# Patient Record
Sex: Female | Born: 1980 | Race: Black or African American | Hispanic: No | Marital: Single | Smoking: Current some day smoker
Health system: Southern US, Community
[De-identification: ages and names within clinical notes are randomized; demographics above are authoritative.]

## PROBLEM LIST (undated history)

## (undated) DIAGNOSIS — I10 Essential (primary) hypertension: Secondary | ICD-10-CM

---

## 2004-08-20 ENCOUNTER — Emergency Department: Payer: Self-pay | Admitting: Emergency Medicine

## 2004-11-24 ENCOUNTER — Emergency Department: Payer: Self-pay | Admitting: Emergency Medicine

## 2005-09-11 ENCOUNTER — Emergency Department: Payer: Self-pay | Admitting: Emergency Medicine

## 2006-05-10 ENCOUNTER — Emergency Department: Payer: Self-pay | Admitting: Emergency Medicine

## 2006-09-26 ENCOUNTER — Emergency Department: Payer: Self-pay | Admitting: Emergency Medicine

## 2006-11-08 ENCOUNTER — Observation Stay: Payer: Self-pay | Admitting: Unknown Physician Specialty

## 2006-11-16 ENCOUNTER — Observation Stay: Payer: Self-pay | Admitting: Obstetrics & Gynecology

## 2006-11-19 ENCOUNTER — Inpatient Hospital Stay: Payer: Self-pay | Admitting: Unknown Physician Specialty

## 2008-03-07 ENCOUNTER — Emergency Department: Payer: Self-pay | Admitting: Emergency Medicine

## 2009-04-09 ENCOUNTER — Emergency Department: Payer: Self-pay | Admitting: Emergency Medicine

## 2010-05-07 ENCOUNTER — Emergency Department: Payer: Self-pay | Admitting: Unknown Physician Specialty

## 2010-05-22 ENCOUNTER — Emergency Department: Payer: Self-pay | Admitting: Emergency Medicine

## 2010-10-18 ENCOUNTER — Observation Stay: Payer: Self-pay

## 2010-11-06 ENCOUNTER — Inpatient Hospital Stay: Payer: Self-pay | Admitting: Obstetrics and Gynecology

## 2010-11-13 LAB — PATHOLOGY REPORT

## 2010-11-24 ENCOUNTER — Emergency Department: Payer: Self-pay | Admitting: Emergency Medicine

## 2011-01-27 ENCOUNTER — Emergency Department (HOSPITAL_COMMUNITY)
Admission: EM | Admit: 2011-01-27 | Discharge: 2011-01-27 | Disposition: A | Discharge status: Home / Self Care | Payer: Self-pay | Attending: Emergency Medicine | Admitting: Emergency Medicine

## 2011-01-27 DIAGNOSIS — W269XXA Contact with unspecified sharp object(s), initial encounter: Secondary | ICD-10-CM | POA: Insufficient documentation

## 2011-01-27 DIAGNOSIS — S058X9A Other injuries of unspecified eye and orbit, initial encounter: Secondary | ICD-10-CM | POA: Insufficient documentation

## 2011-01-27 DIAGNOSIS — Y93E8 Activity, other personal hygiene: Secondary | ICD-10-CM | POA: Insufficient documentation

## 2011-01-27 DIAGNOSIS — Y92009 Unspecified place in unspecified non-institutional (private) residence as the place of occurrence of the external cause: Secondary | ICD-10-CM | POA: Insufficient documentation

## 2011-01-27 DIAGNOSIS — Y998 Other external cause status: Secondary | ICD-10-CM | POA: Insufficient documentation

## 2011-02-11 ENCOUNTER — Emergency Department (HOSPITAL_COMMUNITY)
Admission: EM | Admit: 2011-02-11 | Discharge: 2011-02-11 | Disposition: A | Discharge status: Home / Self Care | Payer: Self-pay | Attending: Emergency Medicine | Admitting: Emergency Medicine

## 2011-02-11 DIAGNOSIS — R109 Unspecified abdominal pain: Secondary | ICD-10-CM | POA: Insufficient documentation

## 2011-02-11 LAB — CBC
Hemoglobin: 13.9 g/dL (ref 12.0–15.0)
MCH: 30 pg (ref 26.0–34.0)
Platelets: 292 10*3/uL (ref 150–400)
RBC: 4.64 MIL/uL (ref 3.87–5.11)
WBC: 7.5 10*3/uL (ref 4.0–10.5)

## 2011-02-11 LAB — DIFFERENTIAL
Basophils Absolute: 0 10*3/uL (ref 0.0–0.1)
Basophils Relative: 0 % (ref 0–1)
Monocytes Relative: 10 % (ref 3–12)
Neutro Abs: 4.6 10*3/uL (ref 1.7–7.7)
Neutrophils Relative %: 61 % (ref 43–77)

## 2011-02-11 LAB — COMPREHENSIVE METABOLIC PANEL
ALT: 23 U/L (ref 0–35)
AST: 19 U/L (ref 0–37)
Alkaline Phosphatase: 71 U/L (ref 39–117)
CO2: 23 mEq/L (ref 19–32)
Chloride: 105 mEq/L (ref 96–112)
GFR calc Af Amer: >60 mL/min (ref >60)
GFR calc non Af Amer: >60 mL/min (ref >60)
Glucose, Bld: 77 mg/dL (ref 70–99)
Potassium: 3.8 mEq/L (ref 3.5–5.1)
Sodium: 137 mEq/L (ref 135–145)
Total Bilirubin: 0.5 mg/dL (ref 0.3–1.2)

## 2011-02-11 LAB — URINALYSIS, ROUTINE W REFLEX MICROSCOPIC
Bilirubin Urine: NEGATIVE
Glucose, UA: NEGATIVE mg/dL
Hgb urine dipstick: NEGATIVE
Protein, ur: NEGATIVE mg/dL
Urobilinogen, UA: 0.2 mg/dL (ref 0.0–1.0)

## 2011-02-11 LAB — WET PREP, GENITAL: Yeast Wet Prep HPF POC: NONE SEEN

## 2011-02-13 LAB — GC/CHLAMYDIA PROBE AMP, GENITAL: GC Probe Amp, Genital: NEGATIVE

## 2011-11-05 ENCOUNTER — Emergency Department: Payer: Self-pay | Admitting: Emergency Medicine

## 2012-03-25 ENCOUNTER — Emergency Department: Payer: Self-pay | Admitting: *Deleted

## 2013-01-21 ENCOUNTER — Emergency Department: Payer: Self-pay | Admitting: Internal Medicine

## 2013-10-04 ENCOUNTER — Emergency Department: Payer: Self-pay

## 2013-10-05 LAB — BETA STREP CULTURE(ARMC)

## 2014-05-05 ENCOUNTER — Emergency Department: Payer: Self-pay | Admitting: Emergency Medicine

## 2014-07-08 ENCOUNTER — Emergency Department: Payer: Self-pay | Admitting: Internal Medicine

## 2014-07-08 LAB — DRUG SCREEN, URINE

## 2014-07-08 LAB — URINALYSIS, COMPLETE
BACTERIA: NONE SEEN
BILIRUBIN, UR: NEGATIVE
Blood: NEGATIVE
GLUCOSE, UR: NEGATIVE mg/dL (ref 0–75)
Ketone: NEGATIVE
NITRITE: NEGATIVE
Ph: 5 (ref 4.5–8.0)
Protein: NEGATIVE
RBC,UR: 5 /HPF (ref 0–5)
Specific Gravity: 1.019 (ref 1.003–1.030)
WBC UR: 11 /HPF (ref 0–5)

## 2014-07-08 LAB — COMPREHENSIVE METABOLIC PANEL
ALBUMIN: 3.2 g/dL — AB (ref 3.4–5.0)
ALK PHOS: 59 U/L
ALT: 33 U/L
ANION GAP: 6 — AB (ref 7–16)
AST: 36 U/L (ref 15–37)
BILIRUBIN TOTAL: 0.4 mg/dL (ref 0.2–1.0)
BUN: 9 mg/dL (ref 7–18)
CALCIUM: 8.1 mg/dL — AB (ref 8.5–10.1)
Chloride: 110 mmol/L — ABNORMAL HIGH (ref 98–107)
Co2: 24 mmol/L (ref 21–32)
Creatinine: 0.52 mg/dL — ABNORMAL LOW (ref 0.60–1.30)
EGFR (Non-African Amer.): >60
Glucose: 98 mg/dL (ref 65–99)
OSMOLALITY: 278 (ref 275–301)
Potassium: 4.4 mmol/L (ref 3.5–5.1)
Sodium: 140 mmol/L (ref 136–145)
TOTAL PROTEIN: 7.1 g/dL (ref 6.4–8.2)

## 2014-07-08 LAB — TROPONIN I: Troponin-I: <0.02 ng/mL

## 2014-07-08 LAB — CBC
HCT: 39.5 % (ref 35.0–47.0)
HGB: 12.9 g/dL (ref 12.0–16.0)
MCH: 30.7 pg (ref 26.0–34.0)
MCHC: 32.5 g/dL (ref 32.0–36.0)
MCV: 94 fL (ref 80–100)
Platelet: 259 10*3/uL (ref 150–440)
RBC: 4.18 10*6/uL (ref 3.80–5.20)
RDW: 13 % (ref 11.5–14.5)
WBC: 7 10*3/uL (ref 3.6–11.0)

## 2014-07-08 LAB — TSH: THYROID STIMULATING HORM: 0.623 u[IU]/mL

## 2015-05-27 ENCOUNTER — Encounter: Payer: Self-pay | Admitting: Emergency Medicine

## 2015-05-27 ENCOUNTER — Emergency Department
Admission: EM | Admit: 2015-05-27 | Discharge: 2015-05-27 | Disposition: A | Discharge status: Home / Self Care | Payer: Medicaid Other | Attending: Emergency Medicine | Admitting: Emergency Medicine

## 2015-05-27 DIAGNOSIS — Z202 Contact with and (suspected) exposure to infections with a predominantly sexual mode of transmission: Secondary | ICD-10-CM | POA: Insufficient documentation

## 2015-05-27 DIAGNOSIS — N760 Acute vaginitis: Secondary | ICD-10-CM | POA: Diagnosis not present

## 2015-05-27 DIAGNOSIS — L293 Anogenital pruritus, unspecified: Secondary | ICD-10-CM | POA: Diagnosis present

## 2015-05-27 DIAGNOSIS — Z711 Person with feared health complaint in whom no diagnosis is made: Secondary | ICD-10-CM

## 2015-05-27 DIAGNOSIS — Z720 Tobacco use: Secondary | ICD-10-CM | POA: Insufficient documentation

## 2015-05-27 DIAGNOSIS — B9689 Other specified bacterial agents as the cause of diseases classified elsewhere: Secondary | ICD-10-CM

## 2015-05-27 DIAGNOSIS — I10 Essential (primary) hypertension: Secondary | ICD-10-CM | POA: Diagnosis not present

## 2015-05-27 DIAGNOSIS — Z3202 Encounter for pregnancy test, result negative: Secondary | ICD-10-CM | POA: Insufficient documentation

## 2015-05-27 HISTORY — DX: Essential (primary) hypertension: I10

## 2015-05-27 LAB — URINALYSIS COMPLETE WITH MICROSCOPIC (ARMC ONLY)
Bacteria, UA: NONE SEEN
Bilirubin Urine: NEGATIVE
GLUCOSE, UA: NEGATIVE mg/dL
Hgb urine dipstick: NEGATIVE
KETONES UR: NEGATIVE mg/dL
NITRITE: NEGATIVE
Protein, ur: NEGATIVE mg/dL
SPECIFIC GRAVITY, URINE: 1.017 (ref 1.005–1.030)
pH: 5 (ref 5.0–8.0)

## 2015-05-27 LAB — CHLAMYDIA/NGC RT PCR (ARMC ONLY)
Chlamydia Tr: NOT DETECTED
Chlamydia Tr: INVALID
N GONORRHOEAE: INVALID
N gonorrhoeae: NOT DETECTED

## 2015-05-27 LAB — POCT PREGNANCY, URINE: Preg Test, Ur: NEGATIVE

## 2015-05-27 LAB — WET PREP, GENITAL

## 2015-05-27 MED ORDER — CEFTRIAXONE SODIUM 250 MG IJ SOLR
250.0000 mg | Freq: Once | INTRAMUSCULAR | Status: AC
  Administered 2015-05-27: 250 mg via INTRAMUSCULAR
  Filled 2015-05-27: qty 250

## 2015-05-27 MED ORDER — METRONIDAZOLE 500 MG PO TABS: 500.0000 mg | ORAL_TABLET | Freq: Two times a day (BID) | ORAL | Status: DC

## 2015-05-27 MED ORDER — LIDOCAINE HCL (PF) 1 % IJ SOLN
INTRAMUSCULAR | Status: AC
  Administered 2015-05-27: 0.9 mL
  Filled 2015-05-27: qty 5

## 2015-05-27 MED ORDER — AZITHROMYCIN 250 MG PO TABS
1000.0000 mg | ORAL_TABLET | Freq: Once | ORAL | Status: AC
  Administered 2015-05-27: 1000 mg via ORAL
  Filled 2015-05-27: qty 4

## 2015-05-27 NOTE — ED Notes (Signed)
POC urine pregnancy completed. Result: Negative.   

## 2015-05-27 NOTE — ED Notes (Signed)
Reports pain in vagina, denies discharge.

## 2015-05-27 NOTE — ED Notes (Signed)
Talked with patient via telephone,   She requested that her discharge papers and rx be mailed.

## 2015-05-27 NOTE — Discharge Instructions (Signed)
Bacterial Vaginosis Bacterial vaginosis is a vaginal infection that occurs when the normal balance of bacteria in the vagina is disrupted. It results from an overgrowth of certain bacteria. This is the most common vaginal infection in women of childbearing age. Treatment is important to prevent complications, especially in pregnant women, as it can cause a premature delivery. CAUSES  Bacterial vaginosis is caused by an increase in harmful bacteria that are normally present in smaller amounts in the vagina. Several different kinds of bacteria can cause bacterial vaginosis. However, the reason that the condition develops is not fully understood. RISK FACTORS Certain activities or behaviors can put you at an increased risk of developing bacterial vaginosis, including:  Having a new sex partner or multiple sex partners.  Douching.  Using an intrauterine device (IUD) for contraception. Women do not get bacterial vaginosis from toilet seats, bedding, swimming pools, or contact with objects around them. SIGNS AND SYMPTOMS  Some women with bacterial vaginosis have no signs or symptoms. Common symptoms include:  Grey vaginal discharge.  A fishlike odor with discharge, especially after sexual intercourse.  Itching or burning of the vagina and vulva.  Burning or pain with urination. DIAGNOSIS  Your health care provider will take a medical history and examine the vagina for signs of bacterial vaginosis. A sample of vaginal fluid may be taken. Your health care provider will look at this sample under a microscope to check for bacteria and abnormal cells. A vaginal pH test may also be done.  TREATMENT  Bacterial vaginosis may be treated with antibiotic medicines. These may be given in the form of a pill or a vaginal cream. A second round of antibiotics may be prescribed if the condition comes back after treatment.  HOME CARE INSTRUCTIONS   Only take over-the-counter or prescription medicines as  directed by your health care provider.  If antibiotic medicine was prescribed, take it as directed. Make sure you finish it even if you start to feel better.  Do not have sex until treatment is completed.  Tell all sexual partners that you have a vaginal infection. They should see their health care provider and be treated if they have problems, such as a mild rash or itching.  Practice safe sex by using condoms and only having one sex partner. SEEK MEDICAL CARE IF:   Your symptoms are not improving after 3 days of treatment.  You have increased discharge or pain.  You have a fever. MAKE SURE YOU:   Understand these instructions.  Will watch your condition.  Will get help right away if you are not doing well or get worse. FOR MORE INFORMATION  Centers for Disease Control and Prevention, Division of STD Prevention: www.cdc.gov/std American Sexual Health Association (ASHA): www.ashastd.org  Document Released: 08/20/2005 Document Revised: 06/10/2013 Document Reviewed: 04/01/2013 ExitCare Patient Information 2015 [employer]. This information is not intended to replace advice given to you by your health care provider. Make sure you discuss any questions you have with your health care provider.  

## 2015-05-27 NOTE — ED Provider Notes (Signed)
[facility] Emergency Department Provider Note ____________________________________________  Time seen: Approximately 11:26 AM  I have reviewed the triage vital signs and the nursing notes.   HISTORY  Chief Complaint Vaginal Itching   HPI Lacey Tanner is a 34 y.o. female is here with complaint of vaginal pain with some itching for approximately 3 weeks. She denies any vaginal discharge.Patient states that she had her tubes tied 4 years ago and has not had any problems since then. She states that she has Pap smears done either at the health department or at [facility] health clinic. She was seen at the health Department several months ago where she was given 4 tablets for a bacterial infection. She denies any chance that she's been exposed to STD. She rates her pain is 7 out of 10. She states that nothing makes it worse or better.   Past Medical History  Diagnosis Date  . Hypertension     There are no active problems to display for this patient.   History reviewed. No pertinent past surgical history.  Current Outpatient Rx  Name  Route  Sig  Dispense  Refill  . metroNIDAZOLE (FLAGYL) 500 MG tablet   Oral   Take 1 tablet (500 mg total) by mouth 2 (two) times daily.   14 tablet   0     Allergies Review of patient's allergies indicates no known allergies.  History reviewed. No pertinent family history.  Social History Social History  Substance Use Topics  . Smoking status: Current Every Day Smoker  . Smokeless tobacco: None  . Alcohol Use: None    Review of Systems Constitutional: No fever/chills Eyes: No visual changes. ENT: No sore throat. Cardiovascular: Denies chest pain. Respiratory: Denies shortness of breath. Gastrointestinal: No abdominal pain.  No nausea, no vomiting.  No diarrhea.  No constipation. Genitourinary: Negative for dysuria. Positive pelvic pain, negative discharge Musculoskeletal: Negative for back pain. Skin:  Negative for rash. Neurological: Negative for headaches, focal weakness or numbness.  10-point ROS otherwise negative.  ____________________________________________   PHYSICAL EXAM:  VITAL SIGNS: ED Triage Vitals  Enc Vitals Group     BP 05/27/15 1058 186/121 mmHg     Pulse Rate 05/27/15 1058 82     Resp 05/27/15 1058 18     Temp 05/27/15 1058 98.2 F (36.8 C)     Temp Source 05/27/15 1058 Oral     SpO2 05/27/15 1058 99 %     Weight 05/27/15 1058 220 lb (99.791 kg)     Height 05/27/15 1058  (1.727 m)     Head Cir --      Peak Flow --      Pain Score 05/27/15 1058 7     Pain Loc --      Pain Edu? --      Excl. in GC? --     Constitutional: Alert and oriented. Well appearing and in no acute distress. Eyes: Conjunctivae are normal. PERRL. EOMI. Head: Atraumatic. Nose: No congestion/rhinnorhea. Neck: No stridor.   Cardiovascular: Normal rate, regular rhythm. Grossly normal heart sounds.  Good peripheral circulation. Respiratory: Normal respiratory effort.  No retractions. Lungs CTAB. Gastrointestinal: Soft and nontender. No distention.  Genitourinary: No external rashes or discharge was noted. There is white mucous-type secretions in the vaginal vault and at the cervical os. There is no cervical motion tenderness. There was some right adnexal tenderness that was mild but no masses. Left adnexal no masses or tenderness noted. Musculoskeletal: No  lower extremity tenderness nor edema.  No joint effusions. Neurologic:  Normal speech and language. No gross focal neurologic deficits are appreciated. No gait instability. Skin:  Skin is warm, dry and intact. No rash noted. Psychiatric: Mood and affect are normal. Speech and behavior are normal.  ____________________________________________   LABS (all labs ordered are listed, but only abnormal results are displayed)  Labs Reviewed  WET PREP, GENITAL - Abnormal; Notable for the following:    Yeast Wet Prep HPF POC NONE (*)     Trich, Wet Prep NONE (*)    Clue Cells Wet Prep HPF POC FEW (*)    WBC, Wet Prep HPF POC TOO NUMEROUS TO COUNT (*)    All other components within normal limits  URINALYSIS COMPLETEWITH MICROSCOPIC ([facility] ONLY) - Abnormal; Notable for the following:    Color, Urine YELLOW (*)    APPearance CLEAR (*)    Leukocytes, UA 1+ (*)    Squamous Epithelial / LPF 0-5 (*)    All other components within normal limits  CHLAMYDIA/NGC RT PCR ([facility] ONLY)  POC URINE PREG, ED  POCT PREGNANCY, URINE    PROCEDURES  Procedure(s) performed: None  Critical Care performed: No  ____________________________________________   INITIAL IMPRESSION / ASSESSMENT AND PLAN / ED COURSE  Pertinent labs & imaging results that were available during my care of the patient were reviewed by me and considered in my medical decision making (see chart for details).  Prior discharge patient states that she "had a fling". She states that in the beginning she had protected sex but the next morning had penetration without protection. She is unaware if this person was having any problems at that time or at present. Discussed the findings on her wet prep and due to the changes in her story it was decided that she would be treated for STD. GC and chlamydia test was still pending prior to her discharge. She is also given a prescription for Flagyl to be taken twice a day for 7 days. ____________________________________________   FINAL CLINICAL IMPRESSION(S) / ED DIAGNOSES  Final diagnoses:  Bacterial vaginosis  Concern about STD in female without diagnosis      Tommi Rumps, PA-C 05/27/15 1440  Minna Antis, MD 05/27/15 1600

## 2015-05-27 NOTE — ED Notes (Signed)
Just spoke with patient via telephone and she requested that her prescriptions be mailed.   After talking with charge nurse,  pts d/c papers were placed in an envelope and placed at the first nurse desk.   atttempted to reach patient again to let her know but she is not answering the phone and there is no way to leave a message.

## 2015-05-27 NOTE — ED Notes (Signed)
Attempted to call pt twice to let her know she left without her prescriptions. Unable to reach pt at this time.

## 2015-05-27 NOTE — ED Notes (Signed)
Presents with vaginal pain which is radiating into lower back   Sx's started about 3 weeks ago. worse today

## 2015-12-14 ENCOUNTER — Encounter: Payer: Self-pay | Admitting: Emergency Medicine

## 2015-12-14 ENCOUNTER — Emergency Department
Admission: EM | Admit: 2015-12-14 | Discharge: 2015-12-14 | Disposition: A | Discharge status: Home / Self Care | Payer: Medicaid Other | Attending: Emergency Medicine | Admitting: Emergency Medicine

## 2015-12-14 ENCOUNTER — Emergency Department: Payer: Medicaid Other

## 2015-12-14 DIAGNOSIS — R1031 Right lower quadrant pain: Secondary | ICD-10-CM | POA: Diagnosis present

## 2015-12-14 DIAGNOSIS — Z792 Long term (current) use of antibiotics: Secondary | ICD-10-CM | POA: Insufficient documentation

## 2015-12-14 DIAGNOSIS — I10 Essential (primary) hypertension: Secondary | ICD-10-CM | POA: Insufficient documentation

## 2015-12-14 DIAGNOSIS — R109 Unspecified abdominal pain: Secondary | ICD-10-CM

## 2015-12-14 DIAGNOSIS — F17200 Nicotine dependence, unspecified, uncomplicated: Secondary | ICD-10-CM | POA: Insufficient documentation

## 2015-12-14 LAB — BASIC METABOLIC PANEL
ANION GAP: 5 (ref 5–15)
BUN: 10 mg/dL (ref 6–20)
CO2: 23 mmol/L (ref 22–32)
Calcium: 8.8 mg/dL — ABNORMAL LOW (ref 8.9–10.3)
Chloride: 108 mmol/L (ref 101–111)
Creatinine, Ser: 0.8 mg/dL (ref 0.44–1.00)
GFR calc Af Amer: >60 mL/min (ref >60)
GLUCOSE: 83 mg/dL (ref 65–99)
POTASSIUM: 3.7 mmol/L (ref 3.5–5.1)
Sodium: 136 mmol/L (ref 135–145)

## 2015-12-14 LAB — URINALYSIS COMPLETE WITH MICROSCOPIC (ARMC ONLY)
BILIRUBIN URINE: NEGATIVE
GLUCOSE, UA: NEGATIVE mg/dL
KETONES UR: NEGATIVE mg/dL
Leukocytes, UA: NEGATIVE
Nitrite: NEGATIVE
PROTEIN: NEGATIVE mg/dL
Specific Gravity, Urine: 1.006 (ref 1.005–1.030)
pH: 6 (ref 5.0–8.0)

## 2015-12-14 LAB — CBC
HEMATOCRIT: 37.4 % (ref 35.0–47.0)
HEMOGLOBIN: 12.6 g/dL (ref 12.0–16.0)
MCH: 31.4 pg (ref 26.0–34.0)
MCHC: 33.8 g/dL (ref 32.0–36.0)
MCV: 92.9 fL (ref 80.0–100.0)
Platelets: 273 10*3/uL (ref 150–440)
RBC: 4.02 MIL/uL (ref 3.80–5.20)
RDW: 13.6 % (ref 11.5–14.5)
WBC: 8.5 10*3/uL (ref 3.6–11.0)

## 2015-12-14 LAB — PREGNANCY, URINE: PREG TEST UR: NEGATIVE

## 2015-12-14 MED ORDER — KETOROLAC TROMETHAMINE 30 MG/ML IJ SOLN
30.0000 mg | Freq: Once | INTRAMUSCULAR | Status: AC
  Administered 2015-12-14: 30 mg via INTRAMUSCULAR

## 2015-12-14 MED ORDER — KETOROLAC TROMETHAMINE 30 MG/ML IJ SOLN
INTRAMUSCULAR | Status: AC
  Administered 2015-12-14: 30 mg via INTRAMUSCULAR
  Filled 2015-12-14: qty 1

## 2015-12-14 NOTE — Discharge Instructions (Signed)
Flank Pain Flank pain refers to pain that is located on the side of the body between the upper abdomen and the back. The pain may occur over a short period of time (acute) or may be long-term or reoccurring (chronic). It may be mild or severe. Flank pain can be caused by many things. CAUSES  Some of the more common causes of flank pain include:  Muscle strains.   Muscle spasms.   A disease of your spine (vertebral disk disease).   A lung infection (pneumonia).   Fluid around your lungs (pulmonary edema).   A kidney infection.   Kidney stones.   A very painful skin rash caused by the chickenpox virus (shingles).   Gallbladder disease.  HOME CARE INSTRUCTIONS  Home care will depend on the cause of your pain. In general,  Rest as directed by your caregiver.  Drink enough fluids to keep your urine clear or pale yellow.  Only take over-the-counter or prescription medicines as directed by your caregiver. Some medicines may help relieve the pain.  Tell your caregiver about any changes in your pain.  Follow up with your caregiver as directed. SEEK IMMEDIATE MEDICAL CARE IF:   Your pain is not controlled with medicine.   You have new or worsening symptoms.  Your pain increases.   You have abdominal pain.   You have shortness of breath.   You have persistent nausea or vomiting.   You have swelling in your abdomen.   You feel faint or pass out.   You have blood in your urine.  You have a fever or persistent symptoms for more than 2-3 days.  You have a fever and your symptoms suddenly get worse. MAKE SURE YOU:   Understand these instructions.  Will watch your condition.  Will get help right away if you are not doing well or get worse.   This information is not intended to replace advice given to you by your health care provider. Make sure you discuss any questions you have with your health care provider.   Document Released: 10/11/2005 Document  Revised: 05/14/2012 Document Reviewed: 04/03/2012 Elsevier Interactive Patient Education [employer]2016 [employer]vier Inc.   Please return immediately if condition worsens. Please contact her primary physician or the physician you were given for referral. If you have any specialist physicians involved in her treatment and plan please also contact them. Thank you for using [facility]. Continue with over-the-counter pain medication such as ibuprofen (Motrin) and/or Tylenol. Return emergency department especially for fever.

## 2015-12-14 NOTE — ED Provider Notes (Signed)
Time Seen: Approximately 1433  I have reviewed the triage notes  Chief Complaint: Flank Pain   History of Present Illness: Lacey Tanner is a 35 y.o. female who presents with acute onset of right-sided flank and lower abdominal pain started approximately 2 hours prior to arrival. She denies any associated symptoms such as nausea vomiting, fever. States she has some mild irritation at the end of her urine stream. She hasn't noticed any hematuria. Patient states she took a pain medication at home (she is not sure what the name of it was). She denies any left-sided abdominal or flank pain. She denies any vaginal discharge or bleeding.   Past Medical History  Diagnosis Date  . Hypertension     There are no active problems to display for this patient.   History reviewed. No pertinent past surgical history.  History reviewed. No pertinent past surgical history.  Current Outpatient Rx  Name  Route  Sig  Dispense  Refill  . metroNIDAZOLE (FLAGYL) 500 MG tablet   Oral   Take 1 tablet (500 mg total) by mouth 2 (two) times daily.   14 tablet   0     Allergies:  Review of patient's allergies indicates no known allergies.  Family History: No family history on file.  Social History: Social History  Substance Use Topics  . Smoking status: Current Every Day Smoker -- 0.50 packs/day  . Smokeless tobacco: None  . Alcohol Use: Yes     Comment: occasional     Review of Systems:   10 point review of systems was performed and was otherwise negative:  Constitutional: No fever Eyes: No visual disturbances ENT: No sore throat, ear pain Cardiac: No chest pain Respiratory: No shortness of breath, wheezing, or stridor Abdomen:Right-sided flank pain. No nausea vomiting or diarrhea Endocrine: No weight loss, No night sweats Extremities: Describes numbness for a month on the right lateral surface of her pinky. No weakness Skin: No rashes, easy bruising Neurologic: No focal  weakness, trouble with speech or swollowing Urologic: No dysuria, Hematuria, or urinary frequency   Physical Exam:  ED Triage Vitals  Enc Vitals Group     BP 12/14/15 1355 206/11 mmHg     Pulse Rate 12/14/15 1355 72     Resp 12/14/15 1355 18     Temp 12/14/15 1355 98.2 F (36.8 C)     Temp Source 12/14/15 1355 Oral     SpO2 12/14/15 1355 100 %     Weight 12/14/15 1355 208 lb (94.348 kg)     Height 12/14/15 1355 5\' 8"  (1.727 m)     Head Cir --      Peak Flow --      Pain Score 12/14/15 1351 10     Pain Loc --      Pain Edu? --      Excl. in GC? --     General: Awake , Alert , and Oriented times 3; GCS 15 Head: Normal cephalic , atraumatic Eyes: Pupils equal , round, reactive to light Nose/Throat: No nasal drainage, patent upper airway without erythema or exudate.  Neck: Supple, Full range of motion, No anterior adenopathy or palpable thyroid masses Lungs: Clear to ascultation without wheezes , rhonchi, or rales Heart: Regular rate, regular rhythm without murmurs , gallops , or rubs Abdomen: Mildly obese Soft, non tender without rebound, guarding , or rigidity; bowel sounds positive and symmetric in all 4 quadrants. No organomegaly .       No  focal tenderness over McBurney's point. Negative Murphy's sign Extremities: 2 plus symmetric pulses. No edema, clubbing or cyanosis Neurologic: normal ambulation, Motor symmetric without deficits, sensory intact Skin: warm, dry, no rashes   Labs:   All laboratory work was reviewed including any pertinent negatives or positives listed below:  Labs Reviewed  URINALYSIS COMPLETEWITH MICROSCOPIC ([facility] ONLY) - Abnormal; Notable for the following:    Color, Urine STRAW (*)    APPearance CLEAR (*)    Hgb urine dipstick 1+ (*)    Bacteria, UA RARE (*)    Squamous Epithelial / LPF 0-5 (*)    All other components within normal limits  BASIC METABOLIC PANEL - Abnormal; Notable for the following:    Calcium 8.8 (*)    All other components  within normal limits  CBC  Laboratory work was reviewed and showed no clinically significant abnormalities.    Radiology:    EXAM: CT ABDOMEN AND PELVIS WITHOUT CONTRAST  TECHNIQUE: Multidetector CT imaging of the abdomen and pelvis was performed following the standard protocol without IV contrast.  COMPARISON: None.  FINDINGS: Visualized lung bases are unremarkable. No significant osseous abnormality is noted.  Small gallstones are noted. No focal abnormality is noted in the liver, spleen or pancreas on these unenhanced images. Adrenal glands and kidneys are unremarkable. No hydronephrosis or renal obstruction is noted. No renal or ureteral calculi are noted. The appendix appears normal. There is no evidence of bowel obstruction. No abnormal fluid collection is noted. Urinary bladder appears normal. Uterus and ovaries are unremarkable. No significant adenopathy is noted.  IMPRESSION: Minimal cholelithiasis. No hydronephrosis or renal obstruction is noted. No renal or ureteral calculi are noted.     I personally reviewed the radiologic studies    ED Course:  Differential and felt was either musculoskeletal pain, acute cholecystitis, renal colic, acute appendicitis, urinary tract infection, etc. The patient's objective studies all appeared to be within normal limits and did not appear to be any significant reason that require hospitalization. Patient was advised of the findings of her workup.   Assessment: * Acute unspecified right flank pain  Final Clinical Impression:   Final diagnoses:  Right flank pain     Plan: * Outpatient Patient was advised to return immediately if condition worsens. Patient was advised to follow up with their primary care physician or other specialized physicians involved in their outpatient care. The patient and/or family member/power of attorney had laboratory results reviewed at the bedside. All questions and concerns were  addressed and appropriate discharge instructions were distributed by the nursing staff.             Jennye Moccasin, MD 12/14/15 [phone]

## 2015-12-14 NOTE — ED Notes (Signed)
Patient transported to CT 

## 2015-12-14 NOTE — ED Notes (Signed)
Pt comes into the Ed via POV c/o right sided flank pain and difficulty urinating.  Patient states the pain is in the lower right side of back.  Patient in NAD at this time.  H/o of previous kidney stones.

## 2016-03-28 ENCOUNTER — Emergency Department: Payer: Medicaid Other

## 2016-03-28 ENCOUNTER — Encounter: Payer: Self-pay | Admitting: Emergency Medicine

## 2016-03-28 ENCOUNTER — Emergency Department
Admission: EM | Admit: 2016-03-28 | Discharge: 2016-03-28 | Disposition: A | Discharge status: Home / Self Care | Payer: Medicaid Other | Attending: Emergency Medicine | Admitting: Emergency Medicine

## 2016-03-28 DIAGNOSIS — F17200 Nicotine dependence, unspecified, uncomplicated: Secondary | ICD-10-CM | POA: Diagnosis not present

## 2016-03-28 DIAGNOSIS — S0993XA Unspecified injury of face, initial encounter: Secondary | ICD-10-CM | POA: Diagnosis present

## 2016-03-28 DIAGNOSIS — I10 Essential (primary) hypertension: Secondary | ICD-10-CM | POA: Insufficient documentation

## 2016-03-28 DIAGNOSIS — Y939 Activity, unspecified: Secondary | ICD-10-CM | POA: Insufficient documentation

## 2016-03-28 DIAGNOSIS — R51 Headache: Secondary | ICD-10-CM | POA: Diagnosis not present

## 2016-03-28 DIAGNOSIS — R52 Pain, unspecified: Secondary | ICD-10-CM

## 2016-03-28 DIAGNOSIS — Y929 Unspecified place or not applicable: Secondary | ICD-10-CM | POA: Insufficient documentation

## 2016-03-28 DIAGNOSIS — Z5181 Encounter for therapeutic drug level monitoring: Secondary | ICD-10-CM | POA: Diagnosis not present

## 2016-03-28 DIAGNOSIS — S161XXA Strain of muscle, fascia and tendon at neck level, initial encounter: Secondary | ICD-10-CM | POA: Insufficient documentation

## 2016-03-28 DIAGNOSIS — Y999 Unspecified external cause status: Secondary | ICD-10-CM | POA: Diagnosis not present

## 2016-03-28 DIAGNOSIS — S0083XA Contusion of other part of head, initial encounter: Secondary | ICD-10-CM

## 2016-03-28 LAB — URINE DRUG SCREEN, QUALITATIVE (ARMC ONLY)
AMPHETAMINES, UR SCREEN: NOT DETECTED
Barbiturates, Ur Screen: NOT DETECTED
Benzodiazepine, Ur Scrn: NOT DETECTED
Cannabinoid 50 Ng, Ur ~~LOC~~: POSITIVE — AB
Cocaine Metabolite,Ur ~~LOC~~: NOT DETECTED
MDMA (ECSTASY) UR SCREEN: NOT DETECTED
METHADONE SCREEN, URINE: NOT DETECTED
OPIATE, UR SCREEN: NOT DETECTED
PHENCYCLIDINE (PCP) UR S: NOT DETECTED
Tricyclic, Ur Screen: NOT DETECTED

## 2016-03-28 LAB — POCT PREGNANCY, URINE: Preg Test, Ur: NEGATIVE

## 2016-03-28 MED ORDER — METHOCARBAMOL 500 MG PO TABS: 500.0000 mg | ORAL_TABLET | Freq: Four times a day (QID) | ORAL | 0 refills | Status: DC

## 2016-03-28 MED ORDER — HYDROCODONE-ACETAMINOPHEN 5-325 MG PO TABS: 1.0000 | ORAL_TABLET | ORAL | 0 refills | Status: DC | PRN
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
## 2016-03-28 NOTE — Discharge Instructions (Signed)
Follow-up with your primary care doctor at [facility] if any continued problems. Ice to your face as needed for swelling. Take pain medication only as directed. You may also take ibuprofen for inflammation and pain. Do not take this medication and drive.

## 2016-06-07 ENCOUNTER — Other Ambulatory Visit: Payer: Self-pay | Admitting: Family Medicine

## 2016-06-07 DIAGNOSIS — R102 Pelvic and perineal pain: Secondary | ICD-10-CM

## 2016-06-14 ENCOUNTER — Ambulatory Visit: Payer: Medicaid Other

## 2016-06-25 ENCOUNTER — Ambulatory Visit: Payer: Medicaid Other

## 2016-06-29 ENCOUNTER — Ambulatory Visit: Payer: Medicaid Other

## 2016-09-13 ENCOUNTER — Encounter: Payer: Self-pay | Admitting: Emergency Medicine

## 2016-09-13 ENCOUNTER — Emergency Department
Admission: EM | Admit: 2016-09-13 | Discharge: 2016-09-13 | Disposition: A | Discharge status: Home / Self Care | Payer: Medicaid Other | Attending: Emergency Medicine | Admitting: Emergency Medicine

## 2016-09-13 DIAGNOSIS — O26899 Other specified pregnancy related conditions, unspecified trimester: Secondary | ICD-10-CM | POA: Diagnosis not present

## 2016-09-13 DIAGNOSIS — R109 Unspecified abdominal pain: Secondary | ICD-10-CM | POA: Insufficient documentation

## 2016-09-13 DIAGNOSIS — Z5321 Procedure and treatment not carried out due to patient leaving prior to being seen by health care provider: Secondary | ICD-10-CM | POA: Diagnosis not present

## 2016-09-13 DIAGNOSIS — Z3A00 Weeks of gestation of pregnancy not specified: Secondary | ICD-10-CM | POA: Diagnosis not present

## 2016-09-13 LAB — URINALYSIS, COMPLETE (UACMP) WITH MICROSCOPIC
Bilirubin Urine: NEGATIVE
Glucose, UA: NEGATIVE mg/dL
Hgb urine dipstick: NEGATIVE
KETONES UR: NEGATIVE mg/dL
Nitrite: NEGATIVE
PROTEIN: NEGATIVE mg/dL
Specific Gravity, Urine: 1.01 (ref 1.005–1.030)
pH: 6 (ref 5.0–8.0)

## 2016-09-13 LAB — CBC
HEMATOCRIT: 38.6 % (ref 35.0–47.0)
HEMOGLOBIN: 13.2 g/dL (ref 12.0–16.0)
MCH: 32.3 pg (ref 26.0–34.0)
MCHC: 34.3 g/dL (ref 32.0–36.0)
MCV: 94.2 fL (ref 80.0–100.0)
Platelets: 283 10*3/uL (ref 150–440)
RBC: 4.1 MIL/uL (ref 3.80–5.20)
RDW: 13.5 % (ref 11.5–14.5)
WBC: 7.9 10*3/uL (ref 3.6–11.0)

## 2016-09-13 LAB — COMPREHENSIVE METABOLIC PANEL
ALBUMIN: 3.9 g/dL (ref 3.5–5.0)
ALK PHOS: 53 U/L (ref 38–126)
ALT: 22 U/L (ref 14–54)
ANION GAP: 7 (ref 5–15)
AST: 28 U/L (ref 15–41)
BUN: 9 mg/dL (ref 6–20)
CALCIUM: 9.2 mg/dL (ref 8.9–10.3)
CHLORIDE: 108 mmol/L (ref 101–111)
CO2: 21 mmol/L — AB (ref 22–32)
Creatinine, Ser: 0.6 mg/dL (ref 0.44–1.00)
GFR calc Af Amer: >60 mL/min (ref >60)
GFR calc non Af Amer: >60 mL/min (ref >60)
GLUCOSE: 97 mg/dL (ref 65–99)
Potassium: 3.7 mmol/L (ref 3.5–5.1)
SODIUM: 136 mmol/L (ref 135–145)
Total Bilirubin: 0.4 mg/dL (ref 0.3–1.2)
Total Protein: 7.4 g/dL (ref 6.5–8.1)

## 2016-09-13 LAB — LIPASE, BLOOD: Lipase: 17 U/L (ref 11–51)

## 2016-09-13 NOTE — ED Triage Notes (Signed)
Says she is pregnant ppossibly 30 weeks?   and is worried it is in tubes.  Says her tubse are tied.  Says she was supposed to go to someone, but never went quite a while back.  Lacey Tanner had referred her.   Pt was here with someone else and though she would check in while she was here.  Says she feels it moving around in t here.

## 2016-09-18 ENCOUNTER — Telehealth: Payer: Self-pay | Admitting: Family Medicine

## 2016-09-20 ENCOUNTER — Encounter: Payer: Self-pay | Admitting: Emergency Medicine

## 2016-09-20 ENCOUNTER — Emergency Department: Payer: Medicaid Other

## 2016-09-20 ENCOUNTER — Emergency Department
Admission: EM | Admit: 2016-09-20 | Discharge: 2016-09-20 | Disposition: A | Discharge status: Home / Self Care | Payer: Medicaid Other | Attending: Emergency Medicine | Admitting: Emergency Medicine

## 2016-09-20 ENCOUNTER — Ambulatory Visit: Payer: Medicaid Other

## 2016-09-20 DIAGNOSIS — R109 Unspecified abdominal pain: Secondary | ICD-10-CM

## 2016-09-20 DIAGNOSIS — I10 Essential (primary) hypertension: Secondary | ICD-10-CM | POA: Diagnosis not present

## 2016-09-20 DIAGNOSIS — F17200 Nicotine dependence, unspecified, uncomplicated: Secondary | ICD-10-CM | POA: Insufficient documentation

## 2016-09-20 DIAGNOSIS — N946 Dysmenorrhea, unspecified: Secondary | ICD-10-CM | POA: Insufficient documentation

## 2016-09-20 DIAGNOSIS — Z79899 Other long term (current) drug therapy: Secondary | ICD-10-CM | POA: Diagnosis not present

## 2016-09-20 DIAGNOSIS — R1030 Lower abdominal pain, unspecified: Secondary | ICD-10-CM | POA: Diagnosis present

## 2016-09-20 LAB — COMPREHENSIVE METABOLIC PANEL
ALT: 21 U/L (ref 14–54)
AST: 27 U/L (ref 15–41)
Albumin: 3.7 g/dL (ref 3.5–5.0)
Alkaline Phosphatase: 46 U/L (ref 38–126)
Anion gap: 5 (ref 5–15)
BILIRUBIN TOTAL: 0.9 mg/dL (ref 0.3–1.2)
BUN: 10 mg/dL (ref 6–20)
CHLORIDE: 110 mmol/L (ref 101–111)
CO2: 22 mmol/L (ref 22–32)
CREATININE: 0.55 mg/dL (ref 0.44–1.00)
Calcium: 9 mg/dL (ref 8.9–10.3)
GFR calc Af Amer: >60 mL/min (ref >60)
Glucose, Bld: 95 mg/dL (ref 65–99)
Potassium: 4.3 mmol/L (ref 3.5–5.1)
Sodium: 137 mmol/L (ref 135–145)
TOTAL PROTEIN: 7.1 g/dL (ref 6.5–8.1)

## 2016-09-20 LAB — CBC
HCT: 38.7 % (ref 35.0–47.0)
Hemoglobin: 13.3 g/dL (ref 12.0–16.0)
MCH: 32.2 pg (ref 26.0–34.0)
MCHC: 34.3 g/dL (ref 32.0–36.0)
MCV: 93.8 fL (ref 80.0–100.0)
PLATELETS: 337 10*3/uL (ref 150–440)
RBC: 4.13 MIL/uL (ref 3.80–5.20)
RDW: 13.5 % (ref 11.5–14.5)
WBC: 7.3 10*3/uL (ref 3.6–11.0)

## 2016-09-20 LAB — URINALYSIS, COMPLETE (UACMP) WITH MICROSCOPIC
Bilirubin Urine: NEGATIVE
Glucose, UA: NEGATIVE mg/dL
Ketones, ur: NEGATIVE mg/dL
Nitrite: NEGATIVE
Protein, ur: NEGATIVE mg/dL
SPECIFIC GRAVITY, URINE: 1.014 (ref 1.005–1.030)
pH: 5 (ref 5.0–8.0)

## 2016-09-20 LAB — WET PREP, GENITAL
Clue Cells Wet Prep HPF POC: NONE SEEN
Sperm: NONE SEEN
Trich, Wet Prep: NONE SEEN
Yeast Wet Prep HPF POC: NONE SEEN

## 2016-09-20 LAB — LIPASE, BLOOD: Lipase: 23 U/L (ref 11–51)

## 2016-09-20 LAB — CHLAMYDIA/NGC RT PCR (ARMC ONLY)
Chlamydia Tr: NOT DETECTED
N gonorrhoeae: NOT DETECTED

## 2016-09-20 LAB — PREGNANCY, URINE: Preg Test, Ur: NEGATIVE

## 2016-09-20 MED ORDER — IBUPROFEN 800 MG PO TABS
800.0000 mg | ORAL_TABLET | ORAL | Status: AC
  Administered 2016-09-20: 800 mg via ORAL
  Filled 2016-09-20: qty 1

## 2016-09-20 MED ORDER — ACETAMINOPHEN 500 MG PO TABS
1000.0000 mg | ORAL_TABLET | Freq: Once | ORAL | Status: AC
  Administered 2016-09-20: 1000 mg via ORAL
  Filled 2016-09-20: qty 2

## 2016-09-20 NOTE — ED Provider Notes (Signed)
[facility] Emergency Department Provider Note   ____________________________________________   First MD Initiated Contact with Patient 09/20/16 1017     (approximate)  I have reviewed the triage vital signs and the nursing notes.   HISTORY  Chief Complaint Abdominal Pain    HPI Lacey Tanner is a 36 y.o. female reports that she believes she could be pregnant. She had her tubes tied about 5 years ago, but on New Year's began to think that she might be pregnant as she occasionally feels "kicking" feeling in her left lower abdomen. Sometimes associated with a feeling of crampiness. Her last menstrual cycle was December 1, she reports that she began experiencing menstrual cramping again today and slight vaginal bleeding which she describes as consistent with a "."  No fevers or chills. No nausea or vomiting. No chest pain or trouble breathing. No right-sided abdominal pain. She reports the symptoms will come and go off and on for at least the last month where she expresses a feeling like something is kicking her in her left lower abdomen. She reports she had a pregnancy test at [facility] which she was told was normal and negative  She is sexually active, last sexually active last night. She has had a previous STD years in the past. Denies any pus or abnormal odor  No pain or burning with urination, but reports she did begin to experience slight bleeding from the vagina today  Past Medical History:  Diagnosis Date  . Hypertension     There are no active problems to display for this patient.   History reviewed. No pertinent surgical history.  Prior to Admission medications   Medication Sig Start Date End Date Taking? Authorizing Provider  hydrochlorothiazide (HYDRODIURIL) 25 MG tablet Take 25 mg by mouth daily.   Yes Historical Provider, MD  methocarbamol (ROBAXIN) 500 MG tablet Take 1 tablet (500 mg total) by mouth 4 (four) times daily. 03/28/16   Yes Tommi Rumps, PA-C  HYDROcodone-acetaminophen (NORCO/VICODIN) 5-325 MG tablet Take 1 tablet by mouth every 4 (four) hours as needed for moderate pain. 03/28/16   Tommi Rumps, PA-C    Allergies Patient has no known allergies.  History reviewed. No pertinent family history.  Social History Social History  Substance Use Topics  . Smoking status: Current Every Day Smoker    Packs/day: 0.50  . Smokeless tobacco: Never Used  . Alcohol use Yes     Comment: occasional    Review of Systems Constitutional: No fever/chills Eyes: No visual changes. ENT: No sore throat. Cardiovascular: Denies chest pain. Respiratory: Denies shortness of breath. Gastrointestinal: No nausea, no vomiting.  No diarrhea.  No constipation. Genitourinary: Negative for dysuria. Musculoskeletal: Negative for back pain. Skin: Negative for rash. Neurological: Negative for headaches, focal weakness or numbness.  10-point ROS otherwise negative.  ____________________________________________   PHYSICAL EXAM:  VITAL SIGNS: ED Triage Vitals [09/20/16 0957]  Enc Vitals Group     BP (!) 176/104     Pulse Rate 76     Resp 18     Temp 98.3 F (36.8 C)     Temp Source Oral     SpO2 99 %     Weight 200 lb (90.7 kg)     Height 5\' 8"  (1.727 m)     Head Circumference      Peak Flow      Pain Score      Pain Loc      Pain Edu?  Excl. in GC?     Constitutional: Alert and oriented. Well appearing and in no acute distress. Eyes: Conjunctivae are normal. PERRL. EOMI. Head: Atraumatic. Nose: No congestion/rhinnorhea. Mouth/Throat: Mucous membranes are moist.  Oropharynx non-erythematous. Neck: No stridor.   Cardiovascular: Normal rate, regular rhythm. Grossly normal heart sounds.  Good peripheral circulation. Respiratory: Normal respiratory effort.  No retractions. Lungs CTAB. Gastrointestinal: Soft and nontender. No distention. No abdominal bruits. No CVA tenderness.No pain at McBurney's point.  Negative Murphy. Pelvic: Performed with nurse Kara MeadEmma. Patient has mild slight vaginal discharge which is light purplish and bloody. No large clots or heavy bleeding. No cervical tenderness. Minimal discomfort in the left adnexa. No masses palpated. No purulent discharge or abnormal odor is noted. Musculoskeletal: No lower extremity tenderness nor edema.  No joint effusions. Neurologic:  Normal speech and language. No gross focal neurologic deficits are appreciated. No gait instability. Skin:  Skin is warm, dry and intact. No rash noted. Psychiatric: Mood and affect are normal. Speech and behavior are normal.  ____________________________________________   LABS (all labs ordered are listed, but only abnormal results are displayed)  Labs Reviewed  WET PREP, GENITAL - Abnormal; Notable for the following:       Result Value   WBC, Wet Prep HPF POC RARE (*)    All other components within normal limits  URINALYSIS, COMPLETE (UACMP) WITH MICROSCOPIC - Abnormal; Notable for the following:    Color, Urine YELLOW (*)    APPearance CLEAR (*)    Hgb urine dipstick LARGE (*)    Leukocytes, UA TRACE (*)    Bacteria, UA RARE (*)    Squamous Epithelial / LPF 0-5 (*)    All other components within normal limits  CHLAMYDIA/NGC RT PCR ([facility] ONLY)  URINE CULTURE  LIPASE, BLOOD  COMPREHENSIVE METABOLIC PANEL  CBC  PREGNANCY, URINE  POC URINE PREG, ED   ____________________________________________  EKG   ____________________________________________  RADIOLOGY  [place] Transvaginal Non-ob  Result Date: 09/20/2016 CLINICAL DATA:  Left-sided pelvic pain. EXAM: TRANSABDOMINAL AND TRANSVAGINAL ULTRASOUND OF PELVIS DOPPLER ULTRASOUND OF OVARIES TECHNIQUE: Both transabdominal and transvaginal ultrasound examinations of the pelvis were performed. Transabdominal technique was performed for global imaging of the pelvis including uterus, ovaries, adnexal regions, and pelvic cul-de-sac. It was necessary  to proceed with endovaginal exam following the transabdominal exam to visualize the uterus and ovaries. Color and duplex Doppler ultrasound was utilized to evaluate blood flow to the ovaries. COMPARISON:  CT of the abdomen and pelvis without contrast on 12/14/2015 FINDINGS: Uterus Measurements: 10.8 x 5.6 x 6.5 cm. No fibroids or other mass visualized. Endometrium Thickness: 10 mm.  No focal abnormality visualized. Right ovary Measurements: 3.0 x 1.9 x 2.3 cm. Normal small follicle. Normal appearance/no adnexal mass. Left ovary Measurements: 2.4 x 1.5 x 1.9 cm. Normal appearance/no adnexal mass. Pulsed Doppler evaluation of both ovaries demonstrates normal low-resistance arterial and venous waveforms. Other findings No abnormal free fluid. IMPRESSION: Normal pelvic ultrasound. Electronically Signed   By: Irish LackGlenn  Yamagata M.D.   On: 09/20/2016 13:05   [place] Pelvis Complete  Result Date: 09/20/2016 CLINICAL DATA:  Left-sided pelvic pain. EXAM: TRANSABDOMINAL AND TRANSVAGINAL ULTRASOUND OF PELVIS DOPPLER ULTRASOUND OF OVARIES TECHNIQUE: Both transabdominal and transvaginal ultrasound examinations of the pelvis were performed. Transabdominal technique was performed for global imaging of the pelvis including uterus, ovaries, adnexal regions, and pelvic cul-de-sac. It was necessary to proceed with endovaginal exam following the transabdominal exam to visualize the uterus and ovaries. Color and duplex Doppler ultrasound  was utilized to evaluate blood flow to the ovaries. COMPARISON:  CT of the abdomen and pelvis without contrast on 12/14/2015 FINDINGS: Uterus Measurements: 10.8 x 5.6 x 6.5 cm. No fibroids or other mass visualized. Endometrium Thickness: 10 mm.  No focal abnormality visualized. Right ovary Measurements: 3.0 x 1.9 x 2.3 cm. Normal small follicle. Normal appearance/no adnexal mass. Left ovary Measurements: 2.4 x 1.5 x 1.9 cm. Normal appearance/no adnexal mass. Pulsed Doppler evaluation of both ovaries  demonstrates normal low-resistance arterial and venous waveforms. Other findings No abnormal free fluid. IMPRESSION: Normal pelvic ultrasound. Electronically Signed   By: Irish Lack M.D.   On: 09/20/2016 13:05   [place] Art/ven Flow Abd Pelv Doppler  Result Date: 09/20/2016 CLINICAL DATA:  Left-sided pelvic pain. EXAM: TRANSABDOMINAL AND TRANSVAGINAL ULTRASOUND OF PELVIS DOPPLER ULTRASOUND OF OVARIES TECHNIQUE: Both transabdominal and transvaginal ultrasound examinations of the pelvis were performed. Transabdominal technique was performed for global imaging of the pelvis including uterus, ovaries, adnexal regions, and pelvic cul-de-sac. It was necessary to proceed with endovaginal exam following the transabdominal exam to visualize the uterus and ovaries. Color and duplex Doppler ultrasound was utilized to evaluate blood flow to the ovaries. COMPARISON:  CT of the abdomen and pelvis without contrast on 12/14/2015 FINDINGS: Uterus Measurements: 10.8 x 5.6 x 6.5 cm. No fibroids or other mass visualized. Endometrium Thickness: 10 mm.  No focal abnormality visualized. Right ovary Measurements: 3.0 x 1.9 x 2.3 cm. Normal small follicle. Normal appearance/no adnexal mass. Left ovary Measurements: 2.4 x 1.5 x 1.9 cm. Normal appearance/no adnexal mass. Pulsed Doppler evaluation of both ovaries demonstrates normal low-resistance arterial and venous waveforms. Other findings No abnormal free fluid. IMPRESSION: Normal pelvic ultrasound. Electronically Signed   By: Irish Lack M.D.   On: 09/20/2016 13:05    ____________________________________________   PROCEDURES  Procedure(s) performed: None  Procedures  Critical Care performed: No  ____________________________________________   INITIAL IMPRESSION / ASSESSMENT AND PLAN / ED COURSE  Pertinent labs & imaging results that were available during my care of the patient were reviewed by me and considered in my medical decision making (see chart for  details).  Patient presents for crampy/kicking left lower quadrant abdominal pain off and off for about a few weeks. Very reassuring clinical exam, no evidence of an acute abdomen. No sudden severe sharp lower abdominal pain to suggest torsion. Her primary complaint seems to be that she thinks she could be pregnant, her pregnancy test here is negative. I reassured her and the patient appears to be understanding of this, reports relief that she is not pregnant as she was concerned she could be even though her tubes are tied. However, given her complaint, slight beginning of vaginal bleeding today which she reports maybe her menstrual cycle starting, we'll proceed and perform a pelvic exam to further evaluate for etiology of the discomfort she drinks printing off and on. I highly doubt this represents torsion, acute appendicitis, or other acute intra-abdominal infection.  Ultrasound reassuring, no evidence acute gynecologic. Wet prep and GC negative for acute abnormality. Patient's symptoms as well as been very intermittent, reassuring abdominal exam, stable clinical examination. Discussed with the patient, recommended close follow-up and return precautions. Patient agreeable, ambulatory and reports feeling well at the time of discharge  Return precautions and treatment recommendations and follow-up discussed with the patient who is agreeable with the plan.       ____________________________________________   FINAL CLINICAL IMPRESSION(S) / ED DIAGNOSES  Final diagnoses:  Abdominal cramping  Moderate  cramps with menses      NEW MEDICATIONS STARTED DURING THIS VISIT:  Discharge Medication List as of 09/20/2016  2:18 PM       Note:  This document was prepared using Dragon voice recognition software and may include unintentional dictation errors.     Sharyn Creamer, MD 09/20/16 [phone]

## 2016-09-20 NOTE — ED Triage Notes (Signed)
Had lower abdominal cramping last week and then started again today. Denies NVD. Denies fevers.has had increased urinary frequency.

## 2016-09-20 NOTE — Discharge Instructions (Signed)
? ?  Please return to the emergency room right away if you are to develop a fever, severe nausea, your pain becomes severe or worsens, you are unable to keep food down, begin vomiting any dark or bloody fluid, you develop any dark or bloody stools, feel dehydrated, or other new concerns or symptoms arise. ? ?

## 2016-09-20 NOTE — ED Notes (Signed)
POCT urine pregnancy negative per Orvilla Fusommy, Medic

## 2016-09-22 LAB — URINE CULTURE
Culture: >=100000 — AB
SPECIAL REQUESTS: NORMAL

## 2016-09-23 NOTE — Progress Notes (Signed)
ED Antimicrobial Stewardship Positive Culture Follow Up   Lacey Tanner is an 36 y.o. female who [facility] with a chief complaint of  Chief Complaint  Patient presents with  . Abdominal Pain    Recent Results (from the past 720 hour(s))  Urine culture     Status: Abnormal   Collection Time: 09/20/16  9:58 AM  Result Value Ref Range Status   Specimen Description URINE, CLEAN CATCH  Final   Special Requests Normal  Final   Culture >=100,000 COLONIES/mL ESCHERICHIA COLI (A)  Final   Report Status 09/22/2016 FINAL  Final   Organism ID, Bacteria ESCHERICHIA COLI (A)  Final      Susceptibility   Escherichia coli - MIC*    AMPICILLIN <=2 SENSITIVE Sensitive     CEFAZOLIN <=4 SENSITIVE Sensitive     CEFTRIAXONE <=1 SENSITIVE Sensitive     CIPROFLOXACIN <=0.25 SENSITIVE Sensitive     GENTAMICIN <=1 SENSITIVE Sensitive     IMIPENEM <=0.25 SENSITIVE Sensitive     NITROFURANTOIN <=16 SENSITIVE Sensitive     TRIMETH/SULFA <=20 SENSITIVE Sensitive     AMPICILLIN/SULBACTAM <=2 SENSITIVE Sensitive     PIP/TAZO <=4 SENSITIVE Sensitive     Extended ESBL NEGATIVE Sensitive     * >=100,000 COLONIES/mL ESCHERICHIA COLI  Wet prep, genital     Status: Abnormal   Collection Time: 09/20/16 11:17 AM  Result Value Ref Range Status   Yeast Wet Prep HPF POC NONE SEEN NONE SEEN Final   Trich, Wet Prep NONE SEEN NONE SEEN Final   Clue Cells Wet Prep HPF POC NONE SEEN NONE SEEN Final   WBC, Wet Prep HPF POC RARE (A) NONE SEEN Final   Sperm NONE SEEN  Final  Chlamydia/NGC rt PCR ([facility] only)     Status: None   Collection Time: 09/20/16 11:17 AM  Result Value Ref Range Status   Specimen source GC/Chlam ENDOCERVICAL  Final   Chlamydia Tr NOT DETECTED NOT DETECTED Final   N gonorrhoeae NOT DETECTED NOT DETECTED Final    Comment: (NOTE) 100  This methodology has not been evaluated in pregnant women or in 200  patients with a history of hysterectomy. 300 400  This  methodology will not be performed on patients less than 2614  years of age.      [x]  Patient discharged originally without antimicrobial agent and treatment is now indicated  New antibiotic prescription: Keflex (cephalexin) 500mg  po BID x 7 days  ED Provider: Willy EddyPatrick Robinson  Phone call Attempt #1 to determine patient's preferred pharmacy: 09/23/16 called [phone] and spoke to lady who said she would get message to patient to call back to [facility] pharmacy  09/23/16- Patient returned phone call- [facility] [address] [place] [phone].   Bari MantisKristin Lyncoln Maskell PharmD Clinical Pharmacist 09/23/2016

## 2016-09-23 NOTE — Progress Notes (Addendum)
ED Antimicrobial Stewardship Positive Culture Follow Up   Lacey CelestineLeshonna D Lamartina is an 36 y.o. female who [facility] with a chief complaint of  Chief Complaint  Patient presents with  . Abdominal Pain    Recent Results (from the past 720 hour(s))  Urine culture     Status: Abnormal   Collection Time: 09/20/16  9:58 AM  Result Value Ref Range Status   Specimen Description URINE, CLEAN CATCH  Final   Special Requests Normal  Final   Culture >=100,000 COLONIES/mL ESCHERICHIA COLI (A)  Final   Report Status 09/22/2016 FINAL  Final   Organism ID, Bacteria ESCHERICHIA COLI (A)  Final      Susceptibility   Escherichia coli - MIC*    AMPICILLIN <=2 SENSITIVE Sensitive     CEFAZOLIN <=4 SENSITIVE Sensitive     CEFTRIAXONE <=1 SENSITIVE Sensitive     CIPROFLOXACIN <=0.25 SENSITIVE Sensitive     GENTAMICIN <=1 SENSITIVE Sensitive     IMIPENEM <=0.25 SENSITIVE Sensitive     NITROFURANTOIN <=16 SENSITIVE Sensitive     TRIMETH/SULFA <=20 SENSITIVE Sensitive     AMPICILLIN/SULBACTAM <=2 SENSITIVE Sensitive     PIP/TAZO <=4 SENSITIVE Sensitive     Extended ESBL NEGATIVE Sensitive     * >=100,000 COLONIES/mL ESCHERICHIA COLI  Wet prep, genital     Status: Abnormal   Collection Time: 09/20/16 11:17 AM  Result Value Ref Range Status   Yeast Wet Prep HPF POC NONE SEEN NONE SEEN Final   Trich, Wet Prep NONE SEEN NONE SEEN Final   Clue Cells Wet Prep HPF POC NONE SEEN NONE SEEN Final   WBC, Wet Prep HPF POC RARE (A) NONE SEEN Final   Sperm NONE SEEN  Final  Chlamydia/NGC rt PCR ([facility] only)     Status: None   Collection Time: 09/20/16 11:17 AM  Result Value Ref Range Status   Specimen source GC/Chlam ENDOCERVICAL  Final   Chlamydia Tr NOT DETECTED NOT DETECTED Final   N gonorrhoeae NOT DETECTED NOT DETECTED Final    Comment: (NOTE) 100  This methodology has not been evaluated in pregnant women or in 200  patients with a history of hysterectomy. 300 400  This  methodology will not be performed on patients less than 9314  years of age.      [x]  Patient discharged originally without antimicrobial agent and treatment is now indicated  New antibiotic prescription: Keflex (cephalexin) 500mg  po BID x 7 days  ED Provider: Willy EddyPatrick Robinson  Phone call Attempt #1 to determine patient's preferred pharmacy: 09/23/16 called [phone] and spoke to lady who said she would get message to patient to [facility]   Bari MantisKristin Dream Nodal PharmD Clinical Pharmacist 09/23/2016

## 2016-09-24 ENCOUNTER — Ambulatory Visit: Payer: Medicaid Other

## 2016-10-12 NOTE — ED Provider Notes (Signed)
Time Seen: Approximately 2257  I have reviewed the triage notes  Chief Complaint: Abdominal Pain   History of Present Illness: Lacey Tanner is a 36 y.o. female who states she was concerned that she is 30 weeks' pregnant? And felt that it may have a tubal pregnancy. The patient denies any vaginal bleeding or discharge. Patient's a very vague historian and describes some lower middle quadrant abdominal pain.   Past Medical History:  Diagnosis Date  . Hypertension     There are no active problems to display for this patient.   History reviewed. No pertinent surgical history.  History reviewed. No pertinent surgical history.  Current Outpatient Rx  . Order #: [id number] Class: Historical Med  . Order #: [id number] Class: Print  . Order #: [id number] Class: Print    Allergies:  Patient has no known allergies.  Family History: No family history on file.  Social History: Social History  Substance Use Topics  . Smoking status: Current Every Day Smoker    Packs/day: 0.50  . Smokeless tobacco: Never Used  . Alcohol use Yes     Comment: occasional     Review of Systems:   10 point review of systems was performed and was otherwise negative:  Constitutional: No fever Eyes: No visual disturbances ENT: No sore throat, ear pain Cardiac: No chest pain Respiratory: No shortness of breath, wheezing, or stridor Abdomen: No abdominal pain, no vomiting, No diarrhea Endocrine: No weight loss, No night sweats Extremities: No peripheral edema, cyanosis Skin: No rashes, easy bruising Neurologic: No focal weakness, trouble with speech or swollowing Urologic: No dysuria, Hematuria, or urinary frequency   Physical Exam:  ED Triage Vitals  Enc Vitals Group     BP 09/13/16 1031 (!) 186/125     Pulse Rate 09/13/16 1031 82     Resp 09/13/16 1031 14     Temp 09/13/16 1031 98.5 F (36.9 C)     Temp Source 09/13/16 1031 Oral     SpO2 09/13/16 1031 98 %     Weight 09/13/16  1032 200 lb (90.7 kg)     Height 09/13/16 1032 5\' 8"  (1.727 m)     Head Circumference --      Peak Flow --      Pain Score 09/13/16 1032 10     Pain Loc --      Pain Edu? --      Excl. in GC? --     General: Awake , Alert , and Oriented times 3; GCS 15 Head: Normal cephalic , atraumatic Eyes: Pupils equal , round, reactive to light Nose/Throat: No nasal drainage, patent upper airway without erythema or exudate.  Neck: Supple, Full range of motion, No anterior adenopathy or palpable thyroid masses Lungs: Clear to ascultation without wheezes , rhonchi, or rales Heart: Regular rate, regular rhythm without murmurs , gallops , or rubs Abdomen: Soft, non tender without rebound, guarding , or rigidity; bowel sounds positive and symmetric in all 4 quadrants. No organomegaly .        Extremities: 2 plus symmetric pulses. No edema, clubbing or cyanosis Neurologic: normal ambulation, Motor symmetric without deficits, sensory intact Skin: warm, dry, no rashes   Labs:   All laboratory work was reviewed including any pertinent negatives or positives listed below:  Labs Reviewed  COMPREHENSIVE METABOLIC PANEL - Abnormal; Notable for the following:       Result Value   CO2 21 (*)    All other components within normal limits  URINALYSIS, COMPLETE (UACMP) WITH MICROSCOPIC - Abnormal; Notable for the following:    Color, Urine STRAW (*)    APPearance CLEAR (*)    Leukocytes, UA TRACE (*)    Bacteria, UA RARE (*)    Squamous Epithelial / LPF 0-5 (*)    All other components within normal limits  LIPASE, BLOOD  CBC  POC URINE PREG, ED  Patient left prior to having a pregnancy test performed here in emergency department  Radiology: "Patient left prior to her ultrasound evaluation   ED Course:  The patient was advised to her my initial assessment that it be unusual to have me really minimal abdominal pain and be at 30 weeks' pregnancy and has a ectopic pregnancy. She states she's had previous  tubal ligation. The patient left without ultrasound assessment and review of her laboratory work at this point and shown to be negative. The patient left after physician evaluation and did not inform the nursing staff of departure from the emergency department     Assessment:  Left AGAINST MEDICAL ADVICE after physician exam    Plan: * Left AGAINST MEDICAL ADVICE after physician exam            Jennye Moccasin, MD 10/12/16 2303

## 2017-07-17 ENCOUNTER — Encounter: Payer: Self-pay | Admitting: Emergency Medicine

## 2017-07-17 ENCOUNTER — Emergency Department
Admission: EM | Admit: 2017-07-17 | Discharge: 2017-07-17 | Disposition: A | Discharge status: Home / Self Care | Payer: Medicaid Other | Attending: Emergency Medicine | Admitting: Emergency Medicine

## 2017-07-17 ENCOUNTER — Emergency Department: Payer: Medicaid Other

## 2017-07-17 DIAGNOSIS — Z79899 Other long term (current) drug therapy: Secondary | ICD-10-CM | POA: Diagnosis not present

## 2017-07-17 DIAGNOSIS — I10 Essential (primary) hypertension: Secondary | ICD-10-CM | POA: Diagnosis not present

## 2017-07-17 DIAGNOSIS — F17200 Nicotine dependence, unspecified, uncomplicated: Secondary | ICD-10-CM | POA: Insufficient documentation

## 2017-07-17 DIAGNOSIS — M25531 Pain in right wrist: Secondary | ICD-10-CM | POA: Diagnosis not present

## 2017-07-17 MED ORDER — TRAMADOL HCL 50 MG PO TABS
50.0000 mg | ORAL_TABLET | Freq: Once | ORAL | Status: AC
  Administered 2017-07-17: 50 mg via ORAL
  Filled 2017-07-17: qty 1

## 2017-07-17 MED ORDER — IBUPROFEN 200 MG PO TABS: 200.0000 mg | ORAL_TABLET | Freq: Four times a day (QID) | ORAL | 2 refills | Status: AC | PRN

## 2017-07-17 MED ORDER — IBUPROFEN 800 MG PO TABS
800.0000 mg | ORAL_TABLET | Freq: Once | ORAL | Status: AC
  Administered 2017-07-17: 800 mg via ORAL
  Filled 2017-07-17: qty 1

## 2017-07-17 MED ORDER — TRAMADOL HCL 50 MG PO TABS: 50.0000 mg | ORAL_TABLET | Freq: Four times a day (QID) | ORAL | 0 refills | Status: DC | PRN

## 2017-07-17 NOTE — Discharge Instructions (Signed)
Wrist splint for 3-5 days as needed. °

## 2017-07-17 NOTE — ED Triage Notes (Signed)
Patient presents to the ED with right wrist pain since hitting her arm at 1am, "on a wall".  Patient is in no obvious distress at this time.  Sensation and mobility intact in right hand.

## 2017-07-17 NOTE — ED Provider Notes (Signed)
[facility] Emergency Department Provider Note   ____________________________________________   First MD Initiated Contact with Patient 07/17/17 [id number]     (approximate)  I have reviewed the triage vital signs and the nursing notes.   HISTORY  Chief Complaint Wrist Pain    HPI Lacey Tanner is a 36 y.o. female patient, right wrist pain secondary to hitting a wall earlier this morning. Patient denies loss sensation distally pain increase with extension and flexion of the wrist.Patient rates the pain as a 10 over 10. No palliative measures for complaint. Patient is right-hand dominant. Patient blood pressure elevated on arrival but admits to not take medication this morning.   Past Medical History:  Diagnosis Date  . Hypertension     There are no active problems to display for this patient.   History reviewed. No pertinent surgical history.  Prior to Admission medications   Medication Sig Start Date End Date Taking? Authorizing Provider  hydrochlorothiazide (HYDRODIURIL) 25 MG tablet Take 25 mg by mouth daily.    [provider]  HYDROcodone-acetaminophen (NORCO/VICODIN) 5-325 MG tablet Take 1 tablet by mouth every 4 (four) hours as needed for moderate pain. 03/28/16   Tommi RumpsSummers, Rhonda L, PA-C  ibuprofen (MOTRIN IB) 200 MG tablet Take 1 tablet (200 mg total) every 6 (six) hours as needed by mouth. 07/17/17 07/17/18  Joni ReiningSmith, Raini Tiley K, PA-C  methocarbamol (ROBAXIN) 500 MG tablet Take 1 tablet (500 mg total) by mouth 4 (four) times daily. 03/28/16   Tommi RumpsSummers, Rhonda L, PA-C  traMADol (ULTRAM) 50 MG tablet Take 1 tablet (50 mg total) every 6 (six) hours as needed by mouth for moderate pain. 07/17/17   Joni ReiningSmith, Nakira Litzau K, PA-C    Allergies Patient has no known allergies.  No family history on file.  Social History Social History   Tobacco Use  . Smoking status: Current Every Day Smoker    Packs/day: 0.50  . Smokeless tobacco: Never Used    Substance Use Topics  . Alcohol use: Yes    Comment: occasional  . Drug use: No    Review of Systems Constitutional: No fever/chills Eyes: No visual changes. ENT: No sore throat. Cardiovascular: Denies chest pain. Respiratory: Denies shortness of breath. Gastrointestinal: No abdominal pain.  No nausea, no vomiting.  No diarrhea.  No constipation. Genitourinary: Negative for dysuria. Musculoskeletal: Right wrist pain  Skin: Negative for rash. Neurological: Negative for headaches, focal weakness or numbness. Endocrine:Hypertension   ____________________________________________   PHYSICAL EXAM:  VITAL SIGNS: ED Triage Vitals [07/17/17 0846]  Enc Vitals Group     BP      Pulse      Resp      Temp      Temp src      SpO2      Weight      Height      Head Circumference      Peak Flow      Pain Score 10     Pain Loc      Pain Edu?      Excl. in GC?    Constitutional: Alert and oriented. Well appearing and in no acute distress. Cardiovascular: Normal rate, regular rhythm. Grossly normal heart sounds.  Good peripheral circulation. Elevated blood pressure Respiratory: Normal respiratory effort.  No retractions. Lungs CTAB. Musculoskeletal: No obvious deformity or edema to the right wrist. Patient is moderate guarding palpation of distal radius and ulnar. Patient decreased range of motion by complaining of pain.  Neurologic:  Normal speech and language. No gross focal neurologic deficits are appreciated. No gait instability. Skin:  Skin is warm, dry and intact. No rash noted. Psychiatric: Mood and affect are normal. Speech and behavior are normal.  ____________________________________________   LABS (all labs ordered are listed, but only abnormal results are displayed)  Labs Reviewed - No data to display ____________________________________________  EKG   ____________________________________________  RADIOLOGY  Dg Wrist Complete Right  Result Date:  07/17/2017 CLINICAL DATA:  Patient porch striking her hand against the wall earlier this morning and has had persistent right wrist pain and limited range of motion since. The patient was unable to tolerate positioning for the navicular view. EXAM: RIGHT WRIST - COMPLETE 3+ VIEW COMPARISON:  None in PACs FINDINGS: The bones are subjectively adequately mineralized. The joint spaces are well maintained. There is subtle calcific density adjacent to the ulnar styloid but a definite donor site is not observed. The distal radius is intact. The carpal bones and metacarpals are unremarkable. IMPRESSION: No acute bony abnormality of the right wrist. Possible old injury of the ulnar styloid. Electronically Signed   By: David  SwazilandJordan M.D.   On: 07/17/2017 09:21    ____________________________________________   PROCEDURES  Procedure(s) performed: None  Procedures  Critical Care performed: No  ____________________________________________   INITIAL IMPRESSION / ASSESSMENT AND PLAN / ED COURSE  As part of my medical decision making, I reviewed the following data within the electronic MEDICAL RECORD NUMBER Notes from prior ED visits and [place] Controlled Substance Database   Right wrist pain secondary to contusion. Discussed negative x-ray finding with patient. Patient placed in a splint and given discharge Instructions. Patient advised take medication as directed and follow-up PCP if condition persists.      ____________________________________________   FINAL CLINICAL IMPRESSION(S) / ED DIAGNOSES  Final diagnoses:  Right wrist pain     ED Discharge Orders        Ordered    traMADol (ULTRAM) 50 MG tablet  Every 6 hours PRN     07/17/17 0932    ibuprofen (MOTRIN IB) 200 MG tablet  Every 6 hours PRN     07/17/17 0932       Note:  This document was prepared using Dragon voice recognition software and may include unintentional dictation errors.    Joni ReiningSmith, Sheryle Vice K, PA-C 07/17/17 [id number]      Emily FilbertWilliams, Jonathan E, MD 07/17/17 [phone]

## 2017-09-29 ENCOUNTER — Other Ambulatory Visit: Payer: Self-pay

## 2017-09-29 ENCOUNTER — Emergency Department
Admission: EM | Admit: 2017-09-29 | Discharge: 2017-09-29 | Disposition: A | Discharge status: Home / Self Care | Payer: Medicaid Other | Attending: Emergency Medicine | Admitting: Emergency Medicine

## 2017-09-29 ENCOUNTER — Encounter: Payer: Self-pay | Admitting: Emergency Medicine

## 2017-09-29 DIAGNOSIS — Z79899 Other long term (current) drug therapy: Secondary | ICD-10-CM | POA: Insufficient documentation

## 2017-09-29 DIAGNOSIS — K047 Periapical abscess without sinus: Secondary | ICD-10-CM | POA: Diagnosis not present

## 2017-09-29 DIAGNOSIS — I10 Essential (primary) hypertension: Secondary | ICD-10-CM | POA: Insufficient documentation

## 2017-09-29 DIAGNOSIS — R600 Localized edema: Secondary | ICD-10-CM | POA: Diagnosis present

## 2017-09-29 DIAGNOSIS — F17200 Nicotine dependence, unspecified, uncomplicated: Secondary | ICD-10-CM | POA: Diagnosis not present

## 2017-09-29 DIAGNOSIS — R220 Localized swelling, mass and lump, head: Secondary | ICD-10-CM

## 2017-09-29 MED ORDER — CLINDAMYCIN HCL 150 MG PO CAPS: 300.0000 mg | ORAL_CAPSULE | Freq: Four times a day (QID) | ORAL | 0 refills | Status: AC

## 2017-09-29 MED ORDER — HYDROCHLOROTHIAZIDE 12.5 MG PO TABS: 12.5000 mg | ORAL_TABLET | Freq: Every day | ORAL | 0 refills | Status: DC

## 2017-09-29 MED ORDER — HYDROCODONE-ACETAMINOPHEN 5-325 MG PO TABS: 1.0000 | ORAL_TABLET | Freq: Four times a day (QID) | ORAL | 0 refills | Status: DC | PRN

## 2017-09-29 MED ORDER — HYDROCHLOROTHIAZIDE 25 MG PO TABS
25.0000 mg | ORAL_TABLET | Freq: Once | ORAL | Status: AC
  Administered 2017-09-29: 25 mg via ORAL
  Filled 2017-09-29: qty 1

## 2017-09-29 MED ORDER — CLINDAMYCIN PHOSPHATE 300 MG/2ML IJ SOLN
INTRAMUSCULAR | Status: AC
  Administered 2017-09-29: 600 mg via INTRAMUSCULAR
  Filled 2017-09-29: qty 4

## 2017-09-29 MED ORDER — CLINDAMYCIN PHOSPHATE 600 MG/4ML IJ SOLN
600.0000 mg | Freq: Once | INTRAMUSCULAR | Status: AC
  Administered 2017-09-29: 600 mg via INTRAMUSCULAR
  Filled 2017-09-29: qty 4

## 2017-09-29 MED ORDER — HYDROCODONE-ACETAMINOPHEN 5-325 MG PO TABS
1.0000 | ORAL_TABLET | ORAL | Status: AC
Start: 2017-09-29 — End: 2017-09-29
  Administered 2017-09-29: 1 via ORAL
  Filled 2017-09-29 (×2): qty 1

## 2017-09-29 NOTE — ED Provider Notes (Signed)
[facility] EMERGENCY DEPARTMENT Provider Note   CSN: [id number] Arrival date & time: 09/29/17  1620     History   Chief Complaint Chief Complaint  Patient presents with  . Facial Swelling    HPI Lacey Tanner is a 37 y.o. female presents to the emergency department for evaluation of dental pain and left-sided facial swelling.  Symptoms began this morning upon awakening.  Pain is 10 out of 10.  She denies any fevers, trouble swallowing, sore throat.  She is been taking BC powders with no improvement.  She denies any drainage, nausea or vomiting.  She has a history of hypertension, has been out of her medications for the last month.  Previous charting indicates patient was on hydrochlorothiazide 25 mg daily.  She denies any chest pain, shortness of breath, headaches.  HPI  Past Medical History:  Diagnosis Date  . Hypertension     There are no active problems to display for this patient.   History reviewed. No pertinent surgical history.  OB History    No data available       Home Medications    Prior to Admission medications   Medication Sig Start Date End Date Taking? Authorizing Provider  clindamycin (CLEOCIN) 150 MG capsule Take 2 capsules (300 mg total) by mouth 4 (four) times daily for 7 days. 09/29/17 10/06/17  Evon SlackGaines, Thomas C, PA-C  hydrochlorothiazide (HYDRODIURIL) 12.5 MG tablet Take 1 tablet (12.5 mg total) by mouth daily. 09/29/17   Evon SlackGaines, Thomas C, PA-C  HYDROcodone-acetaminophen (NORCO) 5-325 MG tablet Take 1 tablet by mouth every 6 (six) hours as needed for moderate pain. 09/29/17   Evon SlackGaines, Thomas C, PA-C  ibuprofen (MOTRIN IB) 200 MG tablet Take 1 tablet (200 mg total) every 6 (six) hours as needed by mouth. 07/17/17 07/17/18  Joni ReiningSmith, Ronald K, PA-C  methocarbamol (ROBAXIN) 500 MG tablet Take 1 tablet (500 mg total) by mouth 4 (four) times daily. 03/28/16   Tommi RumpsSummers, Rhonda L, PA-C  traMADol (ULTRAM) 50 MG tablet Take 1 tablet (50 mg  total) every 6 (six) hours as needed by mouth for moderate pain. 07/17/17   Joni ReiningSmith, Ronald K, PA-C    Family History No family history on file.  Social History Social History   Tobacco Use  . Smoking status: Current Every Day Smoker    Packs/day: 0.50  . Smokeless tobacco: Never Used  Substance Use Topics  . Alcohol use: Yes    Comment: occasional  . Drug use: No     Allergies   Patient has no known allergies.   Review of Systems Review of Systems  Constitutional: Negative.  Negative for chills and fever.  HENT: Positive for dental problem and facial swelling. Negative for drooling, mouth sores, trouble swallowing and voice change.   Respiratory: Negative for shortness of breath.   Cardiovascular: Negative for chest pain.  Gastrointestinal: Negative for nausea and vomiting.  Musculoskeletal: Negative for arthralgias, neck pain and neck stiffness.  Skin: Negative.   Psychiatric/Behavioral: Negative for confusion.  All other systems reviewed and are negative.    Physical Exam Updated Vital Signs BP (!) 194/117 (BP Location: Left Arm)   Pulse 93   Temp 98.3 F (36.8 C) (Oral)   Resp 16   SpO2 97%   Physical Exam  Constitutional: She is oriented to person, place, and time. She appears well-developed and well-nourished. No distress.  HENT:  Head: Normocephalic and atraumatic.  Right Ear: External ear normal.  Left Ear: External  ear normal.  Nose: Nose normal.  Mouth/Throat: Uvula is midline and oropharynx is clear and moist. No oral lesions. No trismus in the jaw. Normal dentition. Dental caries present. No dental abscesses or uvula swelling.    Positive left-sided facial swelling.  Eyes: EOM are normal.  Neck: Normal range of motion. Neck supple.  Cardiovascular: Normal rate. Exam reveals no gallop and no friction rub.  No murmur heard. Pulmonary/Chest: Effort normal and breath sounds normal. No respiratory distress.  Neurological: She is alert and oriented  to person, place, and time.  Skin: Skin is warm and dry.  Psychiatric: She has a normal mood and affect. Her behavior is normal. Thought content normal.     ED Treatments / Results  Labs (all labs ordered are listed, but only abnormal results are displayed) Labs Reviewed - No data to display  EKG  EKG Interpretation None       Radiology No results found.  Procedures Procedures (including critical care time)  Medications Ordered in ED Medications  clindamycin (CLEOCIN) injection 600 mg (not administered)  HYDROcodone-acetaminophen (NORCO/VICODIN) 5-325 MG per tablet 1 tablet (1 tablet Oral Given 09/29/17 1734)  hydrochlorothiazide (HYDRODIURIL) tablet 25 mg (25 mg Oral Given 09/29/17 1735)     Initial Impression / Assessment and Plan / ED Course  I have reviewed the triage vital signs and the nursing notes.  Pertinent labs & imaging results that were available during my care of the patient were reviewed by me and considered in my medical decision making (see chart for details).     37 year old female with sudden onset of left-sided dental pain, facial pain and swelling.  She is given 1 dose of intramuscular clindamycin 600 mg today.  She will start with p.o. clindamycin.  Norco as needed for moderate pain.  Patient encouraged to return for any increasing pain, swelling or fevers.  She is currently afebrile.  She will follow-up with dental clinic first of next week, she is given dental contact information.  She is also restarted on her hydrochlorothiazide which she has been without for the last month.  She is educated on signs and symptoms to return to ED for such as any vision changes, headache, chest pain or shortness of breath. Final Clinical Impressions(s) / ED Diagnoses   Final diagnoses:  Dental abscess  Dental infection  Facial swelling  Hypertension, unspecified type    ED Discharge Orders        Ordered    clindamycin (CLEOCIN) 150 MG capsule  4 times daily       09/29/17 1740    HYDROcodone-acetaminophen (NORCO) 5-325 MG tablet  Every 6 hours PRN     09/29/17 1740    hydrochlorothiazide (HYDRODIURIL) 12.5 MG tablet  Daily     09/29/17 1740       Ronnette Juniper 09/29/17 1804    Arnaldo Natal, MD 10/09/17 [phone]

## 2017-09-29 NOTE — ED Notes (Signed)
Pt has hx/o HTN, pt states that she has not been on her medication in about 1 month.

## 2017-09-29 NOTE — Discharge Instructions (Signed)
Please take antibiotics as prescribed.  Take Norco as needed for severe pain.  Take blood pressure medication daily, follow-up with primary care provider to recheck her blood pressure next week.  Please follow-up with dental clinic next week.  Return to the ER for any increasing pain, swelling or fevers.  OPTIONS FOR DENTAL FOLLOW UP CARE  [place] Department of Health and Human Services - Local Safety Net Dental Clinics [email or web address]://www.ncdhhs.gov/dph/oralhealth/services/safetynetclinics.htm   [facility] [phone])  [facility] [phone]  [facility] [phone])  [facility] [phone])  [facility] [phone]) This clinic caters to the indigent population and is on a lottery system. Location: [employer] [address] [facility], [address], [place] Clinic Hours: Wednesdays from 6pm - 9pm, patients seen by a lottery system. For dates, call or go to [email or web address] Services: Cleanings, fillings and simple extractions. Payment Options: DENTAL WORK IS FREE OF CHARGE. Bring proof of income or support. Best way to get seen: Arrive at 5:15 pm - this is a lottery, NOT first come/first serve, so arriving earlier will not increase your chances of being seen.     [facility] [phone] Select option 1 for emergencies   Location: [facility], [place] [facility], [address], [place] [facility] Hours: No walk-ins accepted - call the day before to schedule an appointment. Check in times are 9:30 am and 1:30 pm. Services: Simple extractions, temporary fillings, pulpectomy/pulp debridement, uncomplicated abscess drainage. Payment Options: PAYMENT IS DUE AT THE TIME OF SERVICE.  Fee is usually $100-200, additional surgical procedures (e.g. abscess drainage) may be extra. Cash, checks, Visa/MasterCard accepted.  Can file Medicaid if patient  is covered for dental - patient should call case worker to check. No discount for [facility] patients. Best way to get seen: MUST call the day before and get onto the schedule. Can usually be seen the next 1-2 days. No walk-ins accepted.     [facility] [phone]   Location: [facility], [address], [place] Clinic Hours: M, W, Th, F 8am or 1:30pm, Tues 9a or 1:30 - first come/first served. Services: Simple extractions, temporary fillings, uncomplicated abscess drainage.  You do not need to be an [place] resident. Payment Options: PAYMENT IS DUE AT THE TIME OF SERVICE. Dental insurance, otherwise sliding scale - bring proof of income or support. Depending on income and treatment needed, cost is usually $50-200. Best way to get seen: Arrive early as it is first come/first served.     [facility] [phone]   Location: [address] Clinic Hours: Mon-Thu 8a-5p Services: Most basic dental services including extractions and fillings. Payment Options: PAYMENT IS DUE AT THE TIME OF SERVICE. Sliding scale, up to 50% off - bring proof if income or support. Medicaid with dental option accepted. Best way to get seen: Call to schedule an appointment, can usually be seen within 2 weeks OR they will try to see walk-ins - show up at 8a or 2p (you may have to wait).     [facility] [phone] [place] RESIDENTS ONLY   Location: [facility], [address], [place], [place] [address] Clinic Hours: By appointment only. Monday - Thursday 8am-5pm, Friday 8am-12pm Services: Cleanings, fillings, extractions. Payment Options: PAYMENT IS DUE AT THE TIME OF SERVICE. Cash, Visa or MasterCard. Sliding scale - $30 minimum per service. Best way to get seen:  Come in to office, complete packet and make an appointment - need proof of income or support  monies for each household member and proof of [facility] residence. Usually takes about a month to get in.     [facility] [phone]   Location: [address] [facility] Clinic Hours: Walk-in Urgent Care Dental Services are offered Monday-Friday mornings only. The numbers of emergencies accepted daily is limited to the number of providers available. Maximum 15 - Mondays, Wednesdays & Thursdays Maximum 10 - Tuesdays & Fridays Services: You do not need to be a [facility] resident to be seen for a dental emergency. Emergencies are defined as pain, swelling, abnormal bleeding, or dental trauma. Walkins will receive x-rays if needed. NOTE: Dental cleaning is not an emergency. Payment Options: PAYMENT IS DUE AT THE TIME OF SERVICE. Minimum co-pay is $40.00 for uninsured patients. Minimum co-pay is $3.00 for Medicaid with dental coverage. Dental Insurance is accepted and must be presented at time of visit. Medicare does not cover dental. Forms of payment: Cash, credit card, checks. Best way to get seen: If not previously registered with the clinic, walk-in dental registration begins at 7:15 am and is on a first come/first serve basis. If previously registered with the clinic, call to make an appointment.     [facility] [phone] [place] RESIDENTS ONLY   Location: [address], [place], [place] Clinic Hours: Mon-Thu 10a-2p Services: Extractions only! Payment Options: FREE (donations accepted) - bring proof of income or support Best way to get seen: Call and schedule an appointment OR come at 8am on the 1st Monday of every month (except for holidays) when it is first come/first served.     [facility] [phone]   Location: [address] [place], [place] Clinic Hours: Friday mornings Services, Payment Options, Best way to get seen: Call for info

## 2017-09-29 NOTE — ED Notes (Signed)
See triage note  States she developed some left sided dental pain yesterday  Woke up this am with swelling noted to left side of face   No diff swallowing

## 2017-09-29 NOTE — ED Triage Notes (Signed)
Pt to ED via POV, pt states that she is having swelling in the left side of her face. Pt states that yesterday she was having left sided dental pain, this morning she woke up and her face was swollen. Pt respirations are equal and unlabored. Pt in NAD at this time.

## 2017-09-30 ENCOUNTER — Other Ambulatory Visit: Payer: Self-pay

## 2017-09-30 ENCOUNTER — Emergency Department
Admission: EM | Admit: 2017-09-30 | Discharge: 2017-09-30 | Disposition: A | Discharge status: Other Acute Inpatient Hospital | Payer: Medicaid Other | Attending: Emergency Medicine | Admitting: Emergency Medicine

## 2017-09-30 ENCOUNTER — Emergency Department: Payer: Medicaid Other

## 2017-09-30 ENCOUNTER — Encounter: Payer: Self-pay | Admitting: Emergency Medicine

## 2017-09-30 DIAGNOSIS — Z79899 Other long term (current) drug therapy: Secondary | ICD-10-CM | POA: Insufficient documentation

## 2017-09-30 DIAGNOSIS — R600 Localized edema: Secondary | ICD-10-CM | POA: Diagnosis present

## 2017-09-30 DIAGNOSIS — E876 Hypokalemia: Secondary | ICD-10-CM | POA: Diagnosis not present

## 2017-09-30 DIAGNOSIS — F17200 Nicotine dependence, unspecified, uncomplicated: Secondary | ICD-10-CM | POA: Diagnosis not present

## 2017-09-30 DIAGNOSIS — I10 Essential (primary) hypertension: Secondary | ICD-10-CM | POA: Insufficient documentation

## 2017-09-30 DIAGNOSIS — L0201 Cutaneous abscess of face: Secondary | ICD-10-CM | POA: Insufficient documentation

## 2017-09-30 DIAGNOSIS — L03211 Cellulitis of face: Secondary | ICD-10-CM | POA: Diagnosis not present

## 2017-09-30 LAB — COMPREHENSIVE METABOLIC PANEL
ALBUMIN: 4.4 g/dL (ref 3.5–5.0)
ALK PHOS: 69 U/L (ref 38–126)
ALT: 19 U/L (ref 14–54)
AST: 25 U/L (ref 15–41)
Anion gap: 12 (ref 5–15)
BILIRUBIN TOTAL: 1.6 mg/dL — AB (ref 0.3–1.2)
BUN: 6 mg/dL (ref 6–20)
CALCIUM: 9.3 mg/dL (ref 8.9–10.3)
CO2: 21 mmol/L — ABNORMAL LOW (ref 22–32)
CREATININE: 0.91 mg/dL (ref 0.44–1.00)
Chloride: 102 mmol/L (ref 101–111)
Glucose, Bld: 134 mg/dL — ABNORMAL HIGH (ref 65–99)
Potassium: 3 mmol/L — ABNORMAL LOW (ref 3.5–5.1)
SODIUM: 135 mmol/L (ref 135–145)
TOTAL PROTEIN: 8.6 g/dL — AB (ref 6.5–8.1)

## 2017-09-30 LAB — CBC WITH DIFFERENTIAL/PLATELET
BASOS ABS: 0.1 10*3/uL (ref 0–0.1)
BASOS PCT: 1 %
EOS ABS: 0 10*3/uL (ref 0–0.7)
EOS PCT: 0 %
HCT: 41.5 % (ref 35.0–47.0)
Hemoglobin: 14.1 g/dL (ref 12.0–16.0)
LYMPHS ABS: 0.9 10*3/uL — AB (ref 1.0–3.6)
Lymphocytes Relative: 7 %
MCH: 32.6 pg (ref 26.0–34.0)
MCHC: 34 g/dL (ref 32.0–36.0)
MCV: 95.7 fL (ref 80.0–100.0)
Monocytes Absolute: 1 10*3/uL — ABNORMAL HIGH (ref 0.2–0.9)
Monocytes Relative: 8 %
Neutro Abs: 10.2 10*3/uL — ABNORMAL HIGH (ref 1.4–6.5)
Neutrophils Relative %: 84 %
PLATELETS: 326 10*3/uL (ref 150–440)
RBC: 4.33 MIL/uL (ref 3.80–5.20)
RDW: 14 % (ref 11.5–14.5)
WBC: 12.1 10*3/uL — AB (ref 3.6–11.0)

## 2017-09-30 LAB — LACTIC ACID, PLASMA: LACTIC ACID, VENOUS: 1.4 mmol/L (ref 0.5–1.9)

## 2017-09-30 MED ORDER — DEXAMETHASONE SODIUM PHOSPHATE 10 MG/ML IJ SOLN
10.0000 mg | Freq: Once | INTRAMUSCULAR | Status: AC
  Administered 2017-09-30: 10 mg via INTRAVENOUS

## 2017-09-30 MED ORDER — IOPAMIDOL (ISOVUE-300) INJECTION 61%
75.0000 mL | Freq: Once | INTRAVENOUS | Status: AC | PRN
  Administered 2017-09-30: 75 mL via INTRAVENOUS
  Filled 2017-09-30: qty 75

## 2017-09-30 MED ORDER — MORPHINE SULFATE (PF) 4 MG/ML IV SOLN
INTRAVENOUS | Status: AC
  Filled 2017-09-30: qty 1

## 2017-09-30 MED ORDER — DEXAMETHASONE SODIUM PHOSPHATE 10 MG/ML IJ SOLN
INTRAMUSCULAR | Status: AC
  Administered 2017-09-30: 10 mg via INTRAVENOUS
  Filled 2017-09-30: qty 1

## 2017-09-30 MED ORDER — CLINDAMYCIN PHOSPHATE 600 MG/50ML IV SOLN
600.0000 mg | Freq: Once | INTRAVENOUS | Status: AC
  Administered 2017-09-30: 600 mg via INTRAVENOUS
  Filled 2017-09-30 (×2): qty 50

## 2017-09-30 MED ORDER — MORPHINE SULFATE (PF) 4 MG/ML IV SOLN
4.0000 mg | Freq: Once | INTRAVENOUS | Status: AC
  Administered 2017-09-30: 4 mg via INTRAVENOUS

## 2017-09-30 NOTE — ED Notes (Signed)
Pt states she was here yesterday for L sided facial swelling. States swelling is getting worse. States she received a vicodin and an antibiotic shot in the butt. States prescription for antibiotic but hasn't started it yet. States was told that it was an abscess, but "I don't think that's what it is. I want an xray to see what it is." pt states "It looks like I had a stroke." pt informed that facial swelling doesn't present with stroke but does with abscess. Pt holding ice pack to face. Pt also c/o of "hands locking up" since yesterday. Alert, oriented, speaking in complete clear sentences, ambulatory.

## 2017-09-30 NOTE — ED Triage Notes (Signed)
Left face swollen and red.  Increased swelling around left eye. No vision changes.  Also c/o migraine, hx migraines.  Ambulatory. No fevers.  Did have chills last night per pt

## 2017-09-30 NOTE — ED Notes (Signed)
Pt taken to CT via wheelchair.

## 2017-09-30 NOTE — ED Triage Notes (Signed)
FIRST NURSE NOTE-seen her yesterday for dental abscess, has not started abx yet.  Left face red and swollen.

## 2017-09-30 NOTE — ED Provider Notes (Signed)
Patient remains stable at this time. Pain was worsening, so patient was ordered IV morphine for pain control given nothing by mouth status and need to avoid NSAIDs at this time. If this is ineffective, may consider IV Tylenol. No evidence of airway threat at this time. Stable for transport.   Sharman CheekStafford, Nike Southwell, MD 09/30/17 Silva [id number]

## 2017-09-30 NOTE — ED Provider Notes (Signed)
[facility] Emergency Department Provider Note  ____________________________________________   First MD Initiated Contact with Patient 09/30/17 1405     (approximate)  I have reviewed the triage vital signs and the nursing notes.   HISTORY  Chief Complaint Facial Swelling    HPI Lacey Tanner is a 37 y.o. female with medical history as listed below who presents for evaluation of rapidly worsening left-sided facial swelling.  Yesterday she awoke with the swelling and pain and came to the emergency department where she was treated with clindamycin for facial cellulitis.  She reports that she did not yet have the opportunity to fill the antibiotics so she got the intramuscular dose in the ED but has not had any additional antibiotics and 24+ hours.  Over that period of time the swelling has gotten worse and now extends up to her lower eyelid.  Her vision is not affected.  She has had some subjective fever and general malaise.  She has some pain in 1 of her upper molars and thinks that is where it started.  She is having no difficulty swallowing or speaking.  She denies chest pain, shortness of breath, nausea, vomiting, and abdominal pain.  The symptoms are severe and nothing in particular makes them better nor worse.  Past Medical History:  Diagnosis Date  . Hypertension     There are no active problems to display for this patient.   History reviewed. No pertinent surgical history.  Prior to Admission medications   Medication Sig Start Date End Date Taking? Authorizing Provider  clindamycin (CLEOCIN) 150 MG capsule Take 2 capsules (300 mg total) by mouth 4 (four) times daily for 7 days. 09/29/17 10/06/17  Evon Slack, PA-C  hydrochlorothiazide (HYDRODIURIL) 12.5 MG tablet Take 1 tablet (12.5 mg total) by mouth daily. 09/29/17   Evon Slack, PA-C  HYDROcodone-acetaminophen (NORCO) 5-325 MG tablet Take 1 tablet by mouth every 6 (six) hours as needed  for moderate pain. 09/29/17   Evon Slack, PA-C  ibuprofen (MOTRIN IB) 200 MG tablet Take 1 tablet (200 mg total) every 6 (six) hours as needed by mouth. 07/17/17 07/17/18  Joni Reining, PA-C  methocarbamol (ROBAXIN) 500 MG tablet Take 1 tablet (500 mg total) by mouth 4 (four) times daily. 03/28/16   Tommi Rumps, PA-C  traMADol (ULTRAM) 50 MG tablet Take 1 tablet (50 mg total) every 6 (six) hours as needed by mouth for moderate pain. 07/17/17   Joni Reining, PA-C    Allergies Patient has no known allergies.  History reviewed. No pertinent family history.  Social History Social History   Tobacco Use  . Smoking status: Current Every Day Smoker    Packs/day: 0.50  . Smokeless tobacco: Never Used  Substance Use Topics  . Alcohol use: Yes    Comment: occasional  . Drug use: No    Review of Systems Constitutional: Subjective fever, general malaise Eyes: No visual changes. ENT: Gradually worsening left upper dental pain and facial swelling that now extends to the bottom of her left eye. Cardiovascular: Denies chest pain. Respiratory: Denies shortness of breath. Gastrointestinal: No abdominal pain.  No nausea, no vomiting.  No diarrhea.  No constipation. Genitourinary: Negative for dysuria. Musculoskeletal: Negative for neck pain.  Negative for back pain. Integumentary: Negative for rash. Neurological: Negative for headaches, focal weakness or numbness.   ____________________________________________   PHYSICAL EXAM:  VITAL SIGNS: ED Triage Vitals  Enc Vitals Group     BP  09/30/17 1242 (!) 157/96     Pulse Rate 09/30/17 1242 79     Resp 09/30/17 1242 18     Temp 09/30/17 1242 98.4 F (36.9 C)     Temp Source 09/30/17 1242 Oral     SpO2 09/30/17 1242 100 %     Weight 09/30/17 1240 77.1 kg (170 lb)     Height 09/30/17 1240 1.727 m (5\' 8" )     Head Circumference --      Peak Flow --      Pain Score 09/30/17 1240 10     Pain Loc --      Pain Edu? --       Excl. in GC? --     Constitutional: Alert and oriented.  No acute distress but with obvious left-sided facial swelling Eyes: Conjunctivae are normal. PERRL. EOMI.  Head: Atraumatic. Mouth/Throat: Mucous membranes are moist.  Oropharynx non-erythematous.  No evidence of Ludwig's angina or obvious intraoral abscess, but the patient has severe left-sided facial swelling including inferior periorbital swelling of the left eye and that extends down to her mandible.  She has some tenderness to palpation below the left side of her mandible but no induration or fluctuance of the neck itself Neck: No stridor.  No meningeal signs.   Cardiovascular: Normal rate, regular rhythm. Good peripheral circulation. Grossly normal heart sounds. Respiratory: Normal respiratory effort.  No retractions. Lungs CTAB. Gastrointestinal: Soft and nontender. No distention.  Musculoskeletal: No lower extremity tenderness nor edema. No gross deformities of extremities. Neurologic:  Normal speech and language. No gross focal neurologic deficits are appreciated.  Skin:  Skin is warm, dry and intact. No rash noted. Psychiatric: Mood and affect are normal. Speech and behavior are normal.  ____________________________________________   LABS (all labs ordered are listed, but only abnormal results are displayed)  Labs Reviewed  COMPREHENSIVE METABOLIC PANEL - Abnormal; Notable for the following components:      Result Value   Potassium 3.0 (*)    CO2 21 (*)    Glucose, Bld 134 (*)    Total Protein 8.6 (*)    Total Bilirubin 1.6 (*)    All other components within normal limits  CBC WITH DIFFERENTIAL/PLATELET - Abnormal; Notable for the following components:   WBC 12.1 (*)    Neutro Abs 10.2 (*)    Lymphs Abs 0.9 (*)    Monocytes Absolute 1.0 (*)    All other components within normal limits  LACTIC ACID, PLASMA   ____________________________________________  EKG  None - EKG not ordered by ED  physician ____________________________________________  RADIOLOGY  ED MD interpretation: The patient has a roughly 2 cm x 1 cm by 1 center odontogenic facial abscess with extensive surrounding cellulitis.   Ct Maxillofacial W Contrast  Result Date: 09/30/2017 CLINICAL DATA:  Left facial swelling and erythema. Swelling about the left eye. EXAM: CT MAXILLOFACIAL WITH CONTRAST TECHNIQUE: Multidetector CT imaging of the maxillofacial structures was performed with intravenous contrast. Multiplanar CT image reconstructions were also generated. CONTRAST:  75mL ISOVUE-300 IOPAMIDOL (ISOVUE-300) INJECTION 61% COMPARISON:  CT of the face 03/28/2016. FINDINGS: Osseous: A lucency is again noted at the apex of the root of the left maxillary canine tooth. A previous root canal is noted. The apical cyst does not change significantly in size. There is high-density material at the periphery of the cyst as before. Cyst appears to be covered. No other focal periapical lucencies are present. Mandible is intact and located. The upper cervical spine is unremarkable.  Orbits: Bilateral globes and orbits are within normal limits. Lacrimal glands are normal. Sinuses: Left greater than right inferior maxillary mucosal thickening is present. There is no visible communication between left maxillary sinus in the periapical cyst. Soft tissues: A subperiosteal abscess surrounds the left maxilla at the level of the periapical cyst. This measures 7 x 15 x 19 mm. This is the epicenter of extensive soft tissue swelling over the left side of the face extending superiorly to the orbit and inferiorly to the angle the mandible. No other subcutaneous abscess is present. Limited intracranial: Within normal limits. IMPRESSION: 1. Odontogenic subperiosteal abscess along the left maxilla with extensive surrounding soft tissue swelling and edema from the left orbit to the angle the mandible. 2. The subperiosteal abscess is centered about a left  periapical cyst at the left canine tooth (tooth 11), the site of the previous root canal. The periapical cyst is chronic, but may have been recently infected. 3. Left greater than right maxillary sinus disease without communication to the cyst or subperiosteal abscess. Electronically Signed   By: Marin Roberts M.D.   On: 09/30/2017 15:23    ____________________________________________   PROCEDURES  Critical Care performed: No   Procedure(s) performed:   Procedures   ____________________________________________   INITIAL IMPRESSION / ASSESSMENT AND PLAN / ED COURSE  As part of my medical decision making, I reviewed the following data within the electronic MEDICAL RECORD NUMBER Nursing notes reviewed and incorporated, Labs reviewed , Spoke with acepting physician and receiving facility,  and Notes from prior ED visits    Differential diagnosis includes, but is not limited to, facial cellulitis, facial abscess, much less likely orbital cellulitis, CVA, etc.  Patient is hypertensive but is persistently so and was yesterday as well.  He is also asymptomatic with the hypertension and there is no indication for urgent treatment.  Regarding the facial swelling, I will obtain a CT scan with IV contrast to evaluate for cellulitis versus abscess which may require surgical drainage and/or dental procedure.  Lab work is notable for mild hypokalemia and mild leukocytosis, otherwise unremarkable.  Lactic acid is within normal limits.  No indication of sepsis.  Patient agrees with the plan for CT scan.  I will treat empirically with clindamycin 600 mg IV and Decadron 10 mg IV for the swelling.  Clinical Course as of Oct 01 1727  Mon Sep 30, 2017  1608 Have paged Dr. Elenore Rota twice to discuss CT results, anticipate that he is in clinic and will call me back ASAP.  [CF]  1635 I spoke by phone with Dr. Elenore Rota and we went over the case in detail.  Given the presence of the facial absence with  odontogenic source, he recommended transfer to [facility] for OMFS evaluation and treatment.  He said that, alternatively, if they cannot accept a transfer at this time, we should admit to [facility] for IV antibiotics and ENT consult for drainage of the facial abscess, but they will not be able to definitively solve the problem because of not having dental privileges.  I will call [facility] to discuss the transfer.  [CF]  1644 I discussed the case by phone with Dr. Cyril Mourning at [facility]  We went over the details and he accepted the patient as a transfer.  I updated the patient and we are now working on transportation options.  [CF]  1724 Patient will need EMTALA reassessment prior to transport.  Made patient NPO given that she may require a procedure.  Dr. Scotty Court assuming care of this area of the department and is aware of the patient.  [CF]    Clinical Course User Index [CF] Loleta Rose, MD    ____________________________________________  FINAL CLINICAL IMPRESSION(S) / ED DIAGNOSES  Final diagnoses:  Facial abscess  Facial cellulitis  Hypokalemia  Essential hypertension     MEDICATIONS GIVEN DURING THIS VISIT:  Medications  clindamycin (CLEOCIN) IVPB 600 mg (0 mg Intravenous Stopped 09/30/17 1617)  dexamethasone (DECADRON) injection 10 mg (10 mg Intravenous Given 09/30/17 1447)  iopamidol (ISOVUE-300) 61 % injection 75 mL (75 mLs Intravenous Contrast Given 09/30/17 1502)     ED Discharge Orders    None       Note:  This document was prepared using Dragon voice recognition software and may include unintentional dictation errors.    Loleta Rose, MD 09/30/17 1729

## 2017-09-30 NOTE — ED Notes (Signed)
EMTALA reviewed. 

## 2018-05-05 IMAGING — DX DG WRIST COMPLETE 3+V*R*
3 series · 3 of 3 positions shown · non-contrast
Comparison: None in PACs

CLINICAL DATA: Patient porch striking her hand against the wall
earlier this morning and has had persistent right wrist pain and
limited range of motion since. The patient was unable to tolerate
positioning for the navicular view.

EXAM:
RIGHT WRIST - COMPLETE 3+ VIEW

[wrist ap]
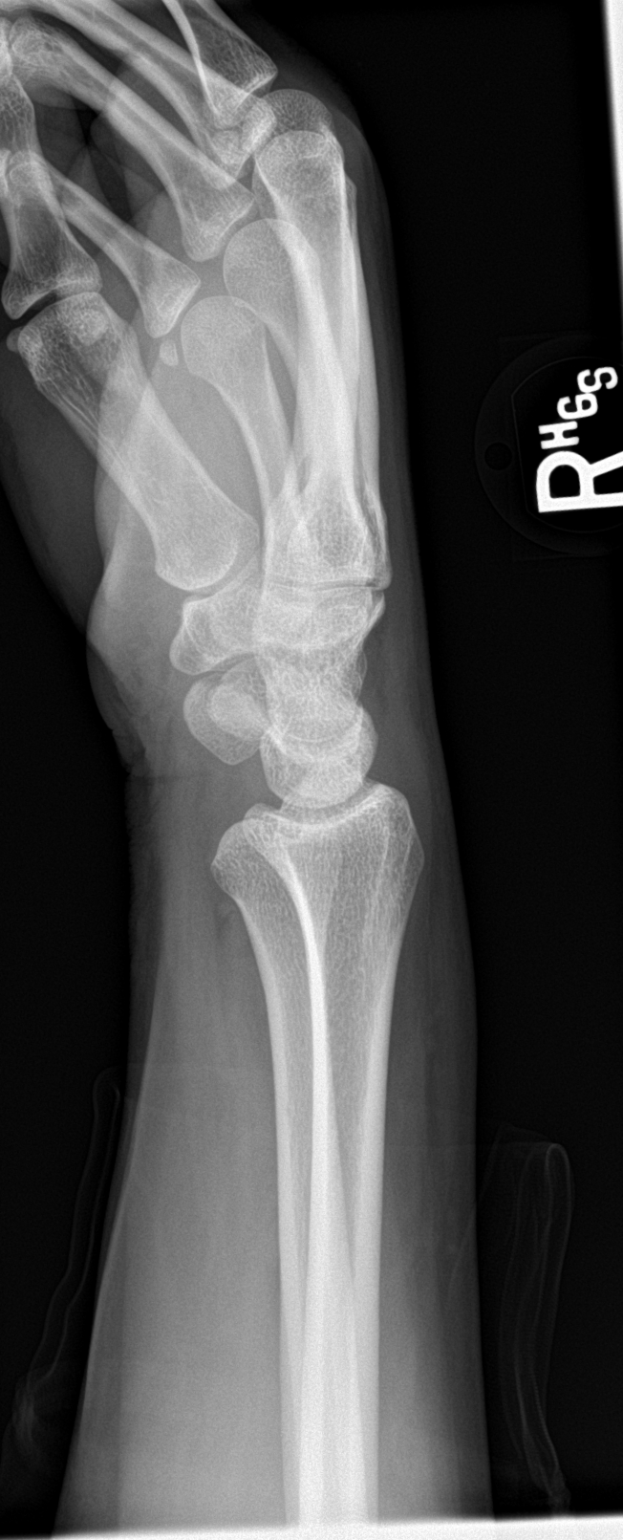

[wrist obl]
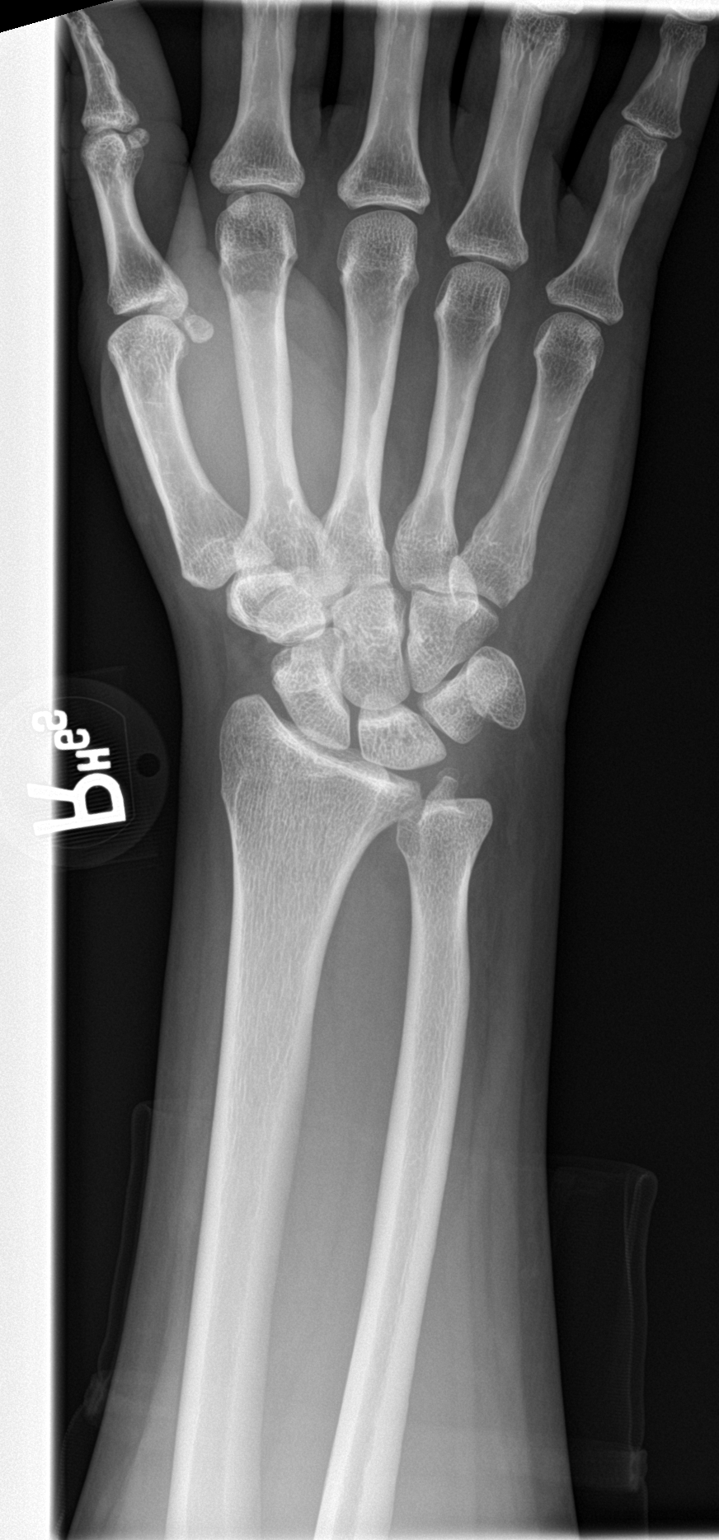

[wrist lat]
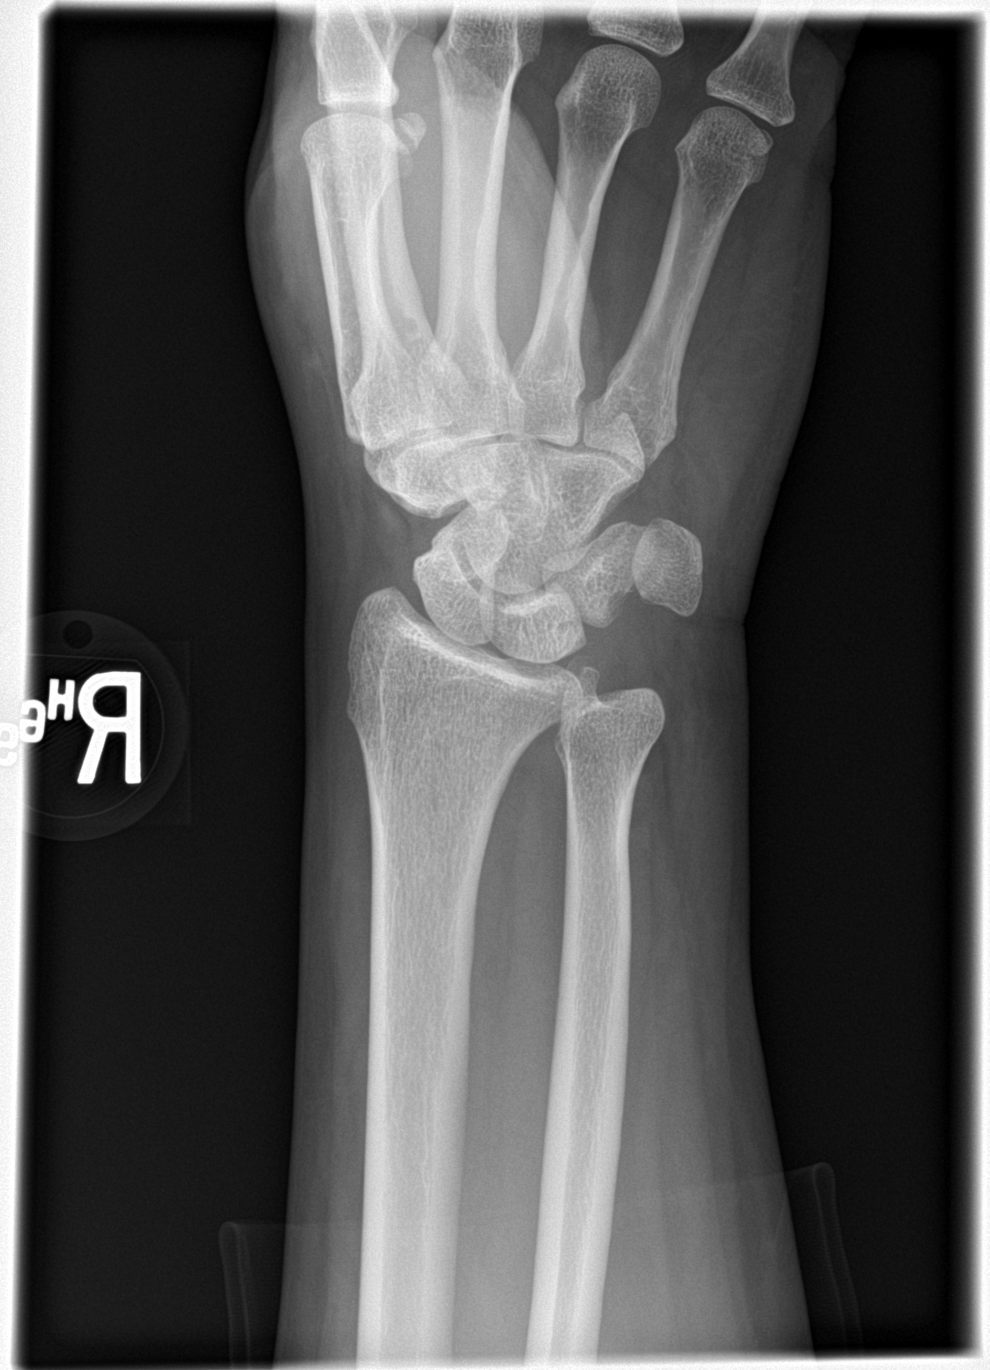

[3 of 3 positions shown; findings below may reference images not displayed]

FINDINGS: The bones are subjectively adequately mineralized. The joint spaces
are well maintained. There is subtle calcific density adjacent to
the ulnar styloid but a definite donor site is not observed. The
distal radius is intact. The carpal bones and metacarpals are
unremarkable.
IMPRESSION: No acute bony abnormality of the right wrist. Possible old injury of
the ulnar styloid.

## 2018-05-30 ENCOUNTER — Other Ambulatory Visit: Payer: Self-pay | Admitting: Primary Care

## 2018-05-30 DIAGNOSIS — R1011 Right upper quadrant pain: Secondary | ICD-10-CM

## 2018-06-06 ENCOUNTER — Ambulatory Visit (HOSPITAL_COMMUNITY): Payer: Medicaid Other | Attending: Primary Care

## 2018-06-18 ENCOUNTER — Ambulatory Visit
Admission: RE | Admit: 2018-06-18 | Discharge: 2018-06-18 | Disposition: A | Discharge status: Home / Self Care | Payer: Medicaid Other | Source: Ambulatory Visit | Attending: Primary Care | Admitting: Primary Care

## 2018-06-18 DIAGNOSIS — R1011 Right upper quadrant pain: Secondary | ICD-10-CM

## 2018-06-19 ENCOUNTER — Ambulatory Visit: Payer: Medicaid Other

## 2018-07-02 ENCOUNTER — Ambulatory Visit: Admission: RE | Admit: 2018-07-02 | Payer: Medicaid Other | Source: Ambulatory Visit

## 2018-07-09 ENCOUNTER — Ambulatory Visit
Admission: RE | Admit: 2018-07-09 | Discharge: 2018-07-09 | Disposition: A | Discharge status: Home / Self Care | Payer: Medicaid Other | Source: Ambulatory Visit | Attending: Primary Care | Admitting: Primary Care

## 2018-07-09 DIAGNOSIS — R1011 Right upper quadrant pain: Secondary | ICD-10-CM | POA: Diagnosis present

## 2018-07-09 DIAGNOSIS — K8020 Calculus of gallbladder without cholecystitis without obstruction: Secondary | ICD-10-CM | POA: Diagnosis not present

## 2018-07-28 ENCOUNTER — Ambulatory Visit: Payer: Medicaid Other | Admitting: Gastroenterology

## 2018-07-28 ENCOUNTER — Encounter: Payer: Self-pay | Admitting: Gastroenterology

## 2018-07-28 DIAGNOSIS — R1011 Right upper quadrant pain: Secondary | ICD-10-CM

## 2018-10-11 ENCOUNTER — Emergency Department
Admission: EM | Admit: 2018-10-11 | Discharge: 2018-10-11 | Disposition: A | Discharge status: Home / Self Care | Payer: Medicaid Other | Attending: Emergency Medicine | Admitting: Emergency Medicine

## 2018-10-11 ENCOUNTER — Other Ambulatory Visit: Payer: Self-pay

## 2018-10-11 ENCOUNTER — Encounter: Payer: Self-pay | Admitting: Intensive Care

## 2018-10-11 DIAGNOSIS — F17200 Nicotine dependence, unspecified, uncomplicated: Secondary | ICD-10-CM | POA: Diagnosis not present

## 2018-10-11 DIAGNOSIS — I10 Essential (primary) hypertension: Secondary | ICD-10-CM | POA: Insufficient documentation

## 2018-10-11 DIAGNOSIS — H5789 Other specified disorders of eye and adnexa: Secondary | ICD-10-CM | POA: Diagnosis present

## 2018-10-11 DIAGNOSIS — H109 Unspecified conjunctivitis: Secondary | ICD-10-CM

## 2018-10-11 DIAGNOSIS — Z113 Encounter for screening for infections with a predominantly sexual mode of transmission: Secondary | ICD-10-CM

## 2018-10-11 DIAGNOSIS — J029 Acute pharyngitis, unspecified: Secondary | ICD-10-CM | POA: Insufficient documentation

## 2018-10-11 DIAGNOSIS — Z202 Contact with and (suspected) exposure to infections with a predominantly sexual mode of transmission: Secondary | ICD-10-CM | POA: Diagnosis not present

## 2018-10-11 DIAGNOSIS — H1089 Other conjunctivitis: Secondary | ICD-10-CM | POA: Diagnosis not present

## 2018-10-11 LAB — WET PREP, GENITAL
CLUE CELLS WET PREP: NONE SEEN
Sperm: NONE SEEN
Trich, Wet Prep: NONE SEEN
YEAST WET PREP: NONE SEEN

## 2018-10-11 LAB — CHLAMYDIA/NGC RT PCR (ARMC ONLY)
CHLAMYDIA TR: NOT DETECTED
Chlamydia Tr: NOT DETECTED
N gonorrhoeae: NOT DETECTED
N gonorrhoeae: NOT DETECTED

## 2018-10-11 LAB — URINALYSIS, COMPLETE (UACMP) WITH MICROSCOPIC
Bacteria, UA: NONE SEEN
Bilirubin Urine: NEGATIVE
GLUCOSE, UA: NEGATIVE mg/dL
Hgb urine dipstick: NEGATIVE
Ketones, ur: NEGATIVE mg/dL
LEUKOCYTES UA: NEGATIVE
Nitrite: NEGATIVE
PROTEIN: NEGATIVE mg/dL
SQUAMOUS EPITHELIAL / LPF: NONE SEEN (ref 0–5)
Specific Gravity, Urine: 1 — ABNORMAL LOW (ref 1.005–1.030)
pH: 6 (ref 5.0–8.0)

## 2018-10-11 LAB — GROUP A STREP BY PCR: Group A Strep by PCR: NOT DETECTED

## 2018-10-11 LAB — PREGNANCY, URINE: Preg Test, Ur: NEGATIVE

## 2018-10-11 MED ORDER — FLUORESCEIN SODIUM 1 MG OP STRP
1.0000 | ORAL_STRIP | Freq: Once | OPHTHALMIC | Status: DC
  Filled 2018-10-11: qty 1

## 2018-10-11 MED ORDER — GENTAMICIN SULFATE 0.3 % OP SOLN: 1.0000 [drp] | Freq: Three times a day (TID) | OPHTHALMIC | 0 refills | Status: AC

## 2018-10-11 MED ORDER — ERYTHROMYCIN 5 MG/GM OP OINT
1.0000 "application " | TOPICAL_OINTMENT | Freq: Four times a day (QID) | OPHTHALMIC | Status: DC
  Administered 2018-10-11: 1 via OPHTHALMIC
  Filled 2018-10-11: qty 1

## 2018-10-11 MED ORDER — TETRACAINE HCL 0.5 % OP SOLN
2.0000 [drp] | Freq: Once | OPHTHALMIC | Status: DC
  Filled 2018-10-11: qty 4
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
## 2018-10-11 NOTE — Discharge Instructions (Signed)
Use the eye drops as directed. Follow-up with your provider for ongoing symptoms. You may call back for any pending test results.
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
## 2019-04-27 IMAGING — US US ABDOMEN COMPLETE
1 series · 14 of 25 positions shown · non-contrast
Comparison: CT 12/14/2015

CLINICAL DATA: Abdominal pain

EXAM:
ABDOMEN ULTRASOUND COMPLETE

[Series 1: us abdomen complete · 14 of 108 slices shown]
[im 1/108]
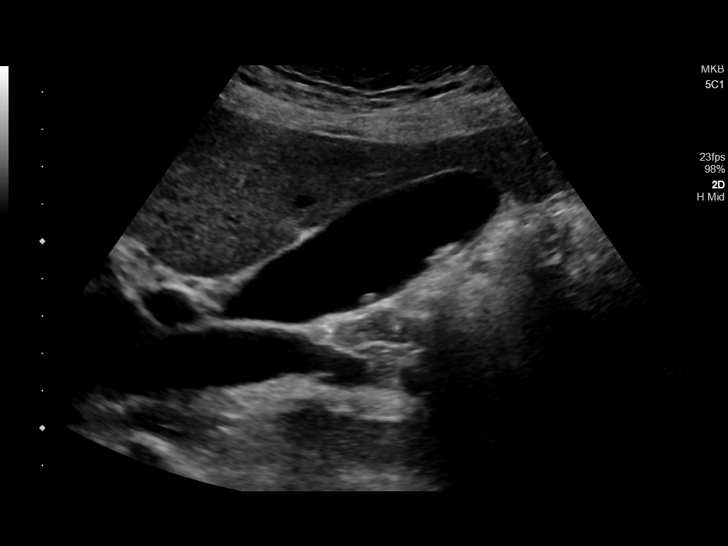
[im 9/108]
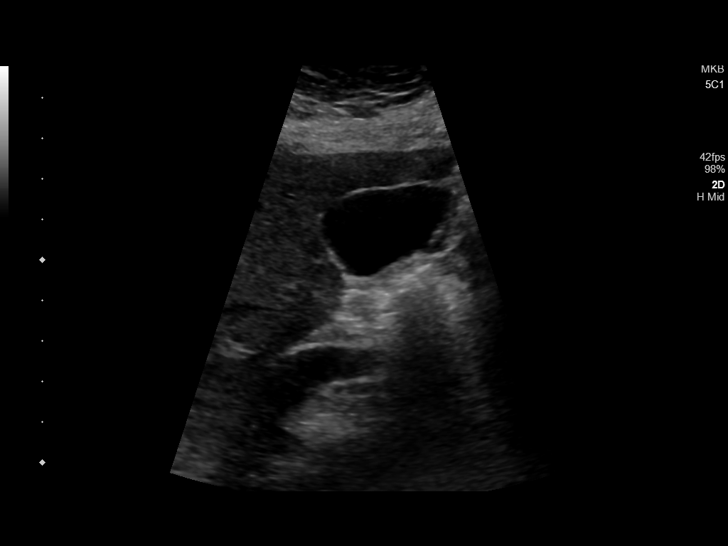
[im 18/108]
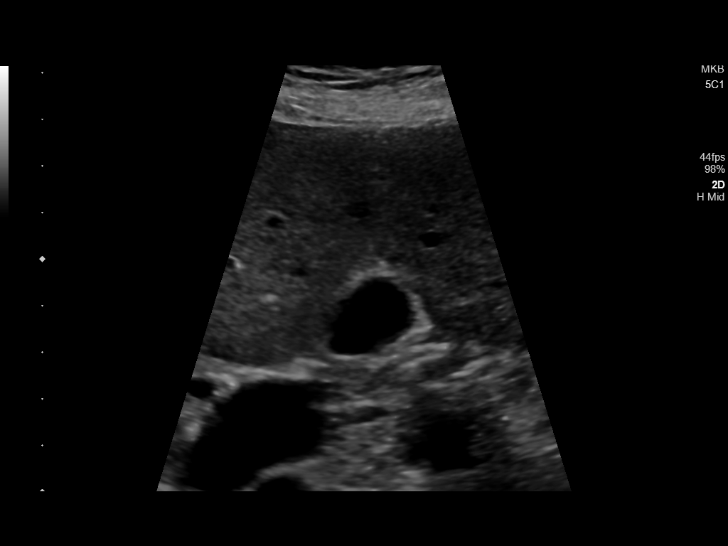
[im 27/108]
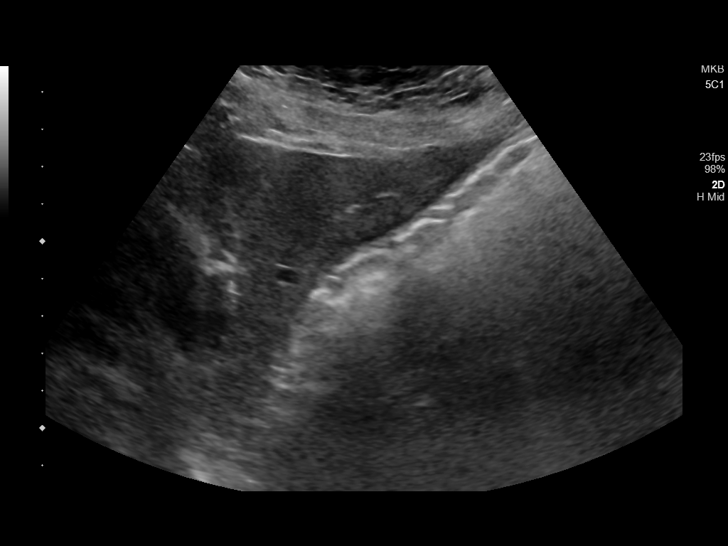
[im 36/108]
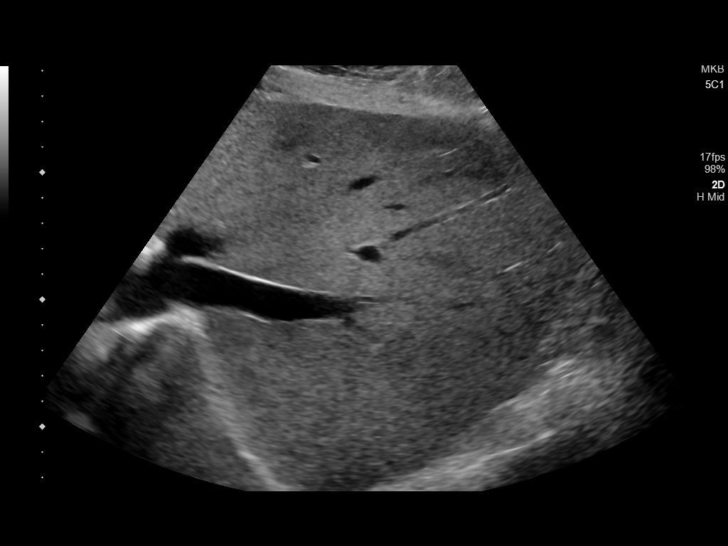
[im 41/108]
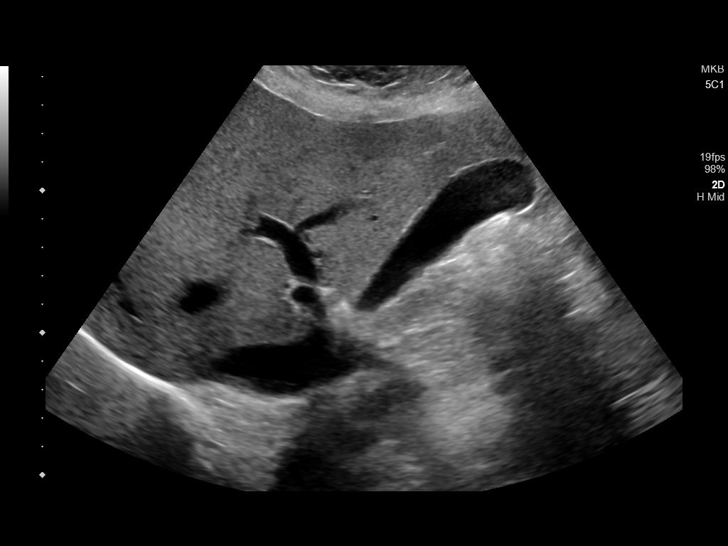
[im 50/108]
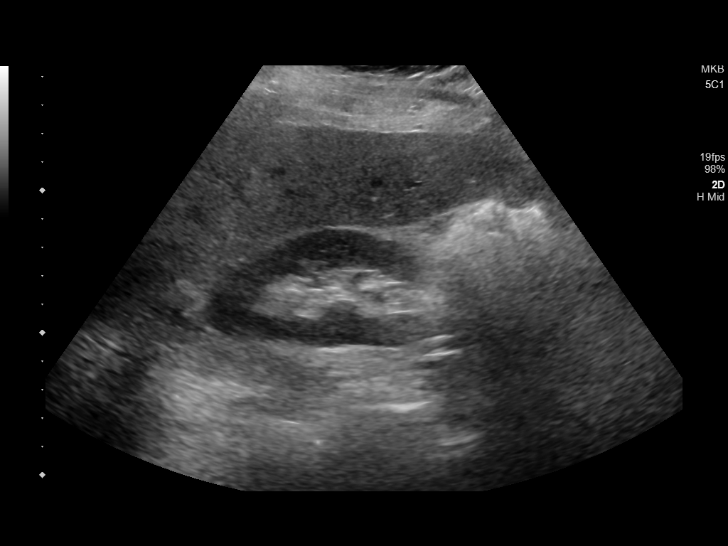
[im 58/108]
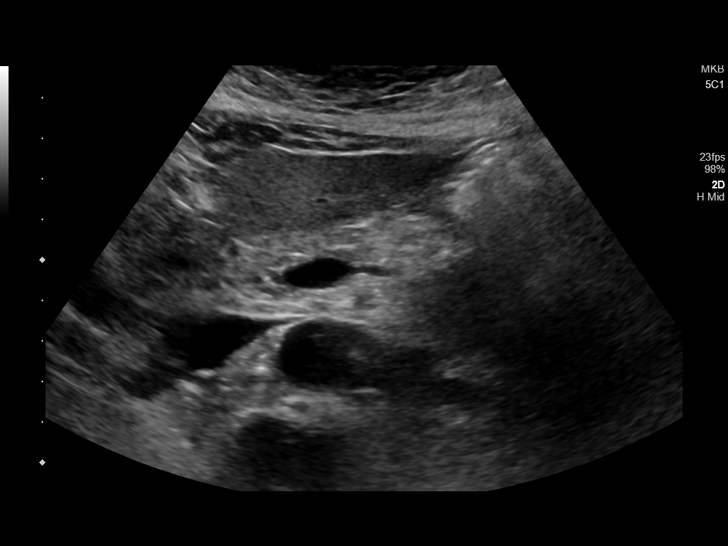
[im 67/108]
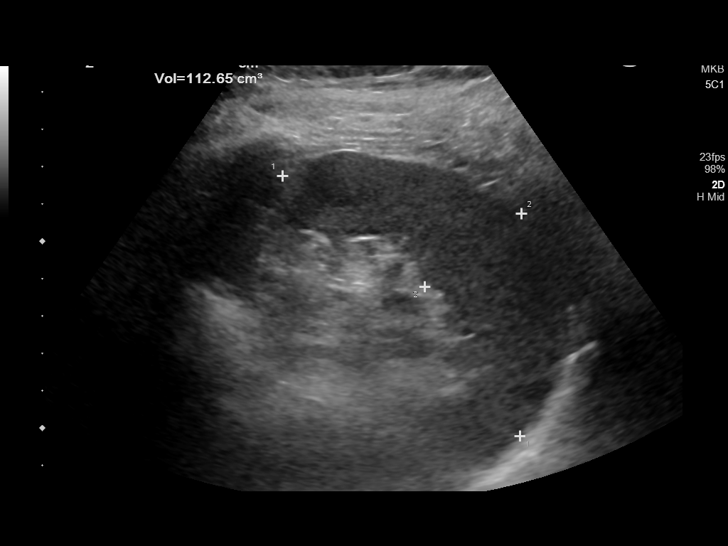
[im 72/108]
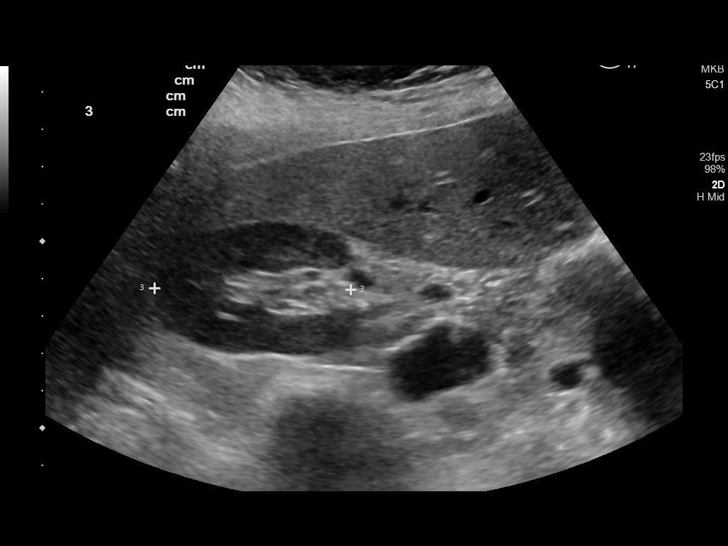
[im 81/108]
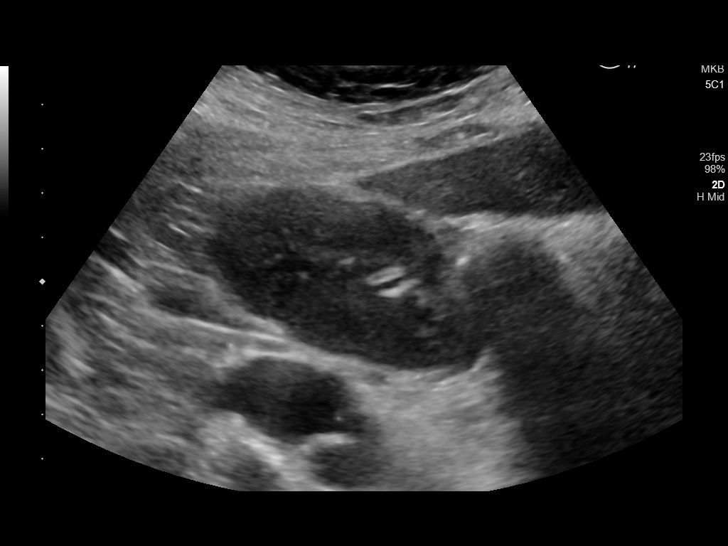
[im 90/108]
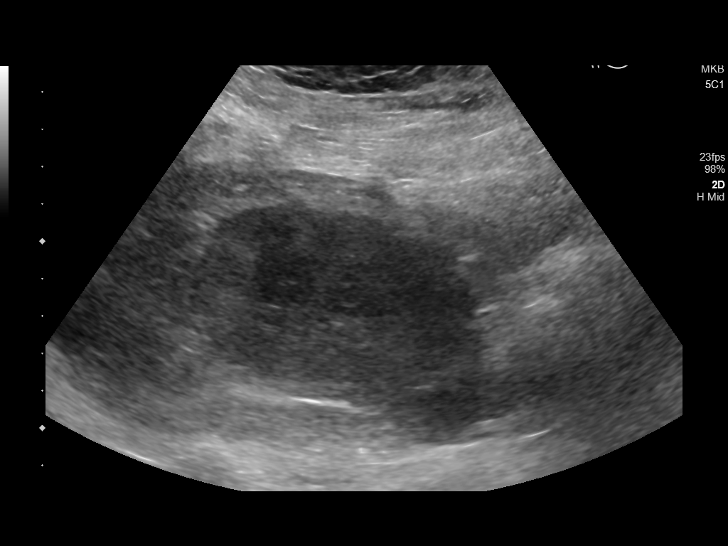
[im 99/108]
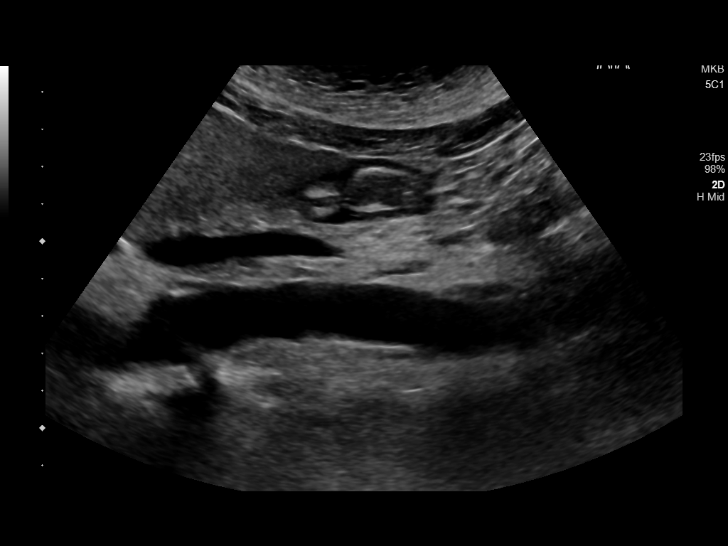
[im 108/108]
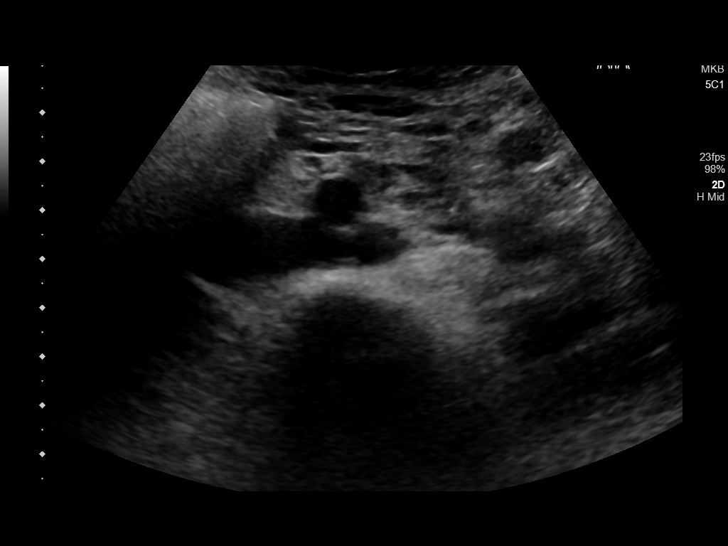

[14 of 25 positions shown; findings below may reference images not displayed]

FINDINGS: Gallbladder: Multiple small stones measuring up to 7 mm. Normal wall
thickness. Negative sonographic Murphy

Common bile duct: Diameter: 3 mm

Liver: Slight increased hepatic echogenicity without focal hepatic
abnormality. Portal vein is patent on color Doppler imaging with
normal direction of blood flow towards the liver.

IVC: No abnormality visualized.

Pancreas: Visualized portion unremarkable.

Spleen: Size and appearance within normal limits.

Right Kidney: 11.9 cm. Echogenicity within normal limits. No mass or
hydronephrosis visualized.

Left Kidney: Length: 11.7 cm. Echogenicity within normal limits. No
mass or hydronephrosis visualized.

Abdominal aorta: No aneurysm visualized.

Other findings: None.
IMPRESSION: 1. Cholelithiasis without sonographic evidence for acute
cholecystitis.
2. Mild increased hepatic echogenicity suggesting steatosis.

## 2020-03-09 ENCOUNTER — Other Ambulatory Visit: Payer: Self-pay | Admitting: Physician Assistant

## 2020-03-09 DIAGNOSIS — R102 Pelvic and perineal pain: Secondary | ICD-10-CM

## 2020-03-15 ENCOUNTER — Ambulatory Visit: Payer: Medicaid Other | Attending: Physician Assistant

## 2020-03-30 ENCOUNTER — Ambulatory Visit: Payer: Medicaid Other | Attending: Physician Assistant

## 2020-04-11 ENCOUNTER — Ambulatory Visit: Payer: Medicaid Other | Attending: Critical Care Medicine

## 2020-04-11 DIAGNOSIS — Z23 Encounter for immunization: Secondary | ICD-10-CM

## 2020-04-11 NOTE — Progress Notes (Signed)
   Covid-19 Vaccination Clinic  Name:  Lacey Tanner    MRN: [id number] DOB: May 25, 1981  04/11/2020  [name] was observed post Covid-19 immunization for 15 minutes without incident. She was provided with Vaccine Information Sheet and instruction to access the V-Safe system.   [name] was instructed to call 911 with any severe reactions post vaccine: Marland Kitchen Difficulty breathing  . Swelling of face and throat  . A fast heartbeat  . A bad rash all over body  . Dizziness and weakness   Immunizations Administered    Name Date Dose VIS Date Route   Pfizer COVID-19 Vaccine 04/11/2020  2:49 PM 0.3 mL 10/28/2018 Intramuscular   Manufacturer: [employer], [employer]   Lot: [id number]   NDC: [address]-5

## 2020-05-02 ENCOUNTER — Ambulatory Visit: Payer: Medicaid Other | Attending: Internal Medicine

## 2020-05-02 DIAGNOSIS — Z23 Encounter for immunization: Secondary | ICD-10-CM

## 2020-05-02 NOTE — Progress Notes (Signed)
   Covid-19 Vaccination Clinic  Name:  BRENNYN ORTLIEB    MRN: [id number] DOB: [birth year]  05/02/2020  Ms. Esty was observed post Covid-19 immunization for 15 minutes without incident. She was provided with Vaccine Information Sheet and instruction to access the V-Safe system.   Ms. Demedeiros was instructed to call 911 with any severe reactions post vaccine: Marland Kitchen Difficulty breathing  . Swelling of face and throat  . A fast heartbeat  . A bad rash all over body  . Dizziness and weakness   Immunizations Administered    Name Date Dose VIS Date Route   Pfizer COVID-19 Vaccine 05/02/2020  1:59 PM 0.3 mL 10/28/2018 Intramuscular   Manufacturer: [employer], [employer]   Lot: [id number]   NDC: [address]-2

## 2020-09-26 ENCOUNTER — Encounter: Payer: Self-pay | Admitting: Family Medicine

## 2021-10-11 ENCOUNTER — Other Ambulatory Visit: Payer: Self-pay | Admitting: Primary Care

## 2021-10-11 DIAGNOSIS — N938 Other specified abnormal uterine and vaginal bleeding: Secondary | ICD-10-CM

## 2021-10-16 ENCOUNTER — Ambulatory Visit: Payer: Medicaid Other

## 2021-11-20 ENCOUNTER — Emergency Department: Payer: Medicaid Other

## 2021-11-20 ENCOUNTER — Other Ambulatory Visit: Payer: Self-pay

## 2021-11-20 ENCOUNTER — Emergency Department
Admission: EM | Admit: 2021-11-20 | Discharge: 2021-11-20 | Disposition: A | Discharge status: Home / Self Care | Payer: Medicaid Other | Attending: Emergency Medicine | Admitting: Emergency Medicine

## 2021-11-20 DIAGNOSIS — R079 Chest pain, unspecified: Secondary | ICD-10-CM

## 2021-11-20 DIAGNOSIS — I10 Essential (primary) hypertension: Secondary | ICD-10-CM | POA: Insufficient documentation

## 2021-11-20 DIAGNOSIS — R0602 Shortness of breath: Secondary | ICD-10-CM | POA: Diagnosis not present

## 2021-11-20 DIAGNOSIS — R519 Headache, unspecified: Secondary | ICD-10-CM | POA: Diagnosis not present

## 2021-11-20 DIAGNOSIS — R0789 Other chest pain: Secondary | ICD-10-CM | POA: Diagnosis present

## 2021-11-20 LAB — BASIC METABOLIC PANEL
Anion gap: 8 (ref 5–15)
BUN: 11 mg/dL (ref 6–20)
CO2: 22 mmol/L (ref 22–32)
Calcium: 8.9 mg/dL (ref 8.9–10.3)
Chloride: 104 mmol/L (ref 98–111)
Creatinine, Ser: 1.06 mg/dL — ABNORMAL HIGH (ref 0.44–1.00)
GFR, Estimated: >60 mL/min (ref >60)
Glucose, Bld: 110 mg/dL — ABNORMAL HIGH (ref 70–99)
Potassium: 3.7 mmol/L (ref 3.5–5.1)
Sodium: 134 mmol/L — ABNORMAL LOW (ref 135–145)

## 2021-11-20 LAB — CBC
HCT: 37.5 % (ref 36.0–46.0)
Hemoglobin: 12 g/dL (ref 12.0–15.0)
MCH: 28.9 pg (ref 26.0–34.0)
MCHC: 32 g/dL (ref 30.0–36.0)
MCV: 90.4 fL (ref 80.0–100.0)
Platelets: 321 10*3/uL (ref 150–400)
RBC: 4.15 MIL/uL (ref 3.87–5.11)
RDW: 14.6 % (ref 11.5–15.5)
WBC: 11.3 10*3/uL — ABNORMAL HIGH (ref 4.0–10.5)
nRBC: 0 % (ref 0.0–0.2)

## 2021-11-20 LAB — TROPONIN I (HIGH SENSITIVITY): Troponin I (High Sensitivity): 4 ng/L (ref <18)

## 2021-11-20 NOTE — Discharge Instructions (Signed)
Your EKG and blood work were reassuring. Please follow-up with your primary care provider if you continue to have chest pain, as they may order a stress test or have you see a cardiologist.  ?

## 2021-11-20 NOTE — ED Triage Notes (Signed)
Patient to ER via POV with complaints of sharp pains that shoot through her head for eight months. Reports the pain is intermittent. Reports she has an appointment with a specialist but today the pains happened four times, states that she doesn't usually have the pains this often.  ? ?Patient also with complaints of sharp and shooting pains through her middle chest. Reports her right shoulder has also been hurting today.  ?

## 2021-11-20 NOTE — ED Provider Notes (Signed)
? ?[facility] ?Provider Note ? ? ? Event Date/Time  ? First MD Initiated Contact with Patient 11/20/21 1745   ?  (approximate) ? ? ?History  ? ?Headache and Chest Pain ? ? ?HPI ? ?Lacey Tanner is a 41 y.o. female with past medical history of hypertension who presents with chief complaint of headache and chest pain.  Headache is been going on for about 4 months.  She endorses intermittent sharp pains across the top of her head that are bilateral.  Comes and goes last for about 2 minutes at a time when it comes on.  Does not feel it every day.  She has no associated visual change, numbness, tingling, weakness nausea, vomiting or visual change.  She takes 800 mg of ibuprofen when it comes on.  Last had a headache briefly when she was in the ER but is now resolved.  Patient also complains of about 4 weeks of intermittent chest pain.  Described as sharp lasting for about several minutes at a time nonexertional nonpleuritic sometimes associated with mild shortness of breath.  Last felt this yesterday. ?  ? ?Past Medical History:  ?Diagnosis Date  ? Hypertension   ? ? ?There are no problems to display for this patient. ? ? ? ?Physical Exam  ?Triage Vital Signs: ?ED Triage Vitals  ?Enc Vitals Group  ?   BP 11/20/21 1743 133/85  ?   Pulse Rate 11/20/21 1743 77  ?   Resp 11/20/21 1743 17  ?   Temp 11/20/21 1743 98 ?F (36.7 ?C)  ?   Temp src --   ?   SpO2 11/20/21 1743 98 %  ?   Weight --   ?   Height 11/20/21 1744 5\' 8"  (1.727 m)  ?   Head Circumference --   ?   Peak Flow --   ?   Pain Score 11/20/21 1744 0  ?   Pain Loc --   ?   Pain Edu? --   ?   Excl. in GC? --   ? ? ?Most recent vital signs: ?Vitals:  ? 11/20/21 1743  ?BP: 133/85  ?Pulse: 77  ?Resp: 17  ?Temp: 98 ?F (36.7 ?C)  ?SpO2: 98%  ? ? ? ?General: Awake, no distress.  ?CV:  Good peripheral perfusion.  ?Resp:  Normal effort.  ?Abd:  No distention.  ?Neuro:             Awake, Alert, Oriented x 3  ?Other:  Aox3, nml speech  ?PERRL, EOMI,  face symmetric, nml tongue movement  ?5/5 strength in the BL upper and lower extremities  ?Sensation grossly intact in the BL upper and lower extremities  ?Finger-nose-finger intact BL ? ? ? ?ED Results / Procedures / Treatments  ?Labs ?(all labs ordered are listed, but only abnormal results are displayed) ?Labs Reviewed  ?BASIC METABOLIC PANEL - Abnormal; Notable for the following components:  ?    Result Value  ? Sodium 134 (*)   ? Glucose, Bld 110 (*)   ? Creatinine, Ser 1.06 (*)   ? All other components within normal limits  ?CBC - Abnormal; Notable for the following components:  ? WBC 11.3 (*)   ? All other components within normal limits  ?POC URINE PREG, ED  ?TROPONIN I (HIGH SENSITIVITY)  ? ? ? ?EKG ? ?ED interpreted by myself normal sinus rhythm, normal axis normal intervals, unifocal PVC ? ? ?RADIOLOGY ?I reviewed the CXR which does not show  any acute cardiopulmonary process; agree with radiology report  ? ? ? ?PROCEDURES: ? ?Critical Care performed: No ? ?Procedures ? ? ? ?MEDICATIONS ORDERED IN ED: ?Medications - No data to display ? ? ?IMPRESSION / MDM / ASSESSMENT AND PLAN / ED COURSE  ?I reviewed the triage vital signs and the nursing notes. ?             ?               ? ?Differential diagnosis includes, but is not limited to, migraine headache, occipital neuralgia, tension headache, pleurisy, GERD, less likely pulmonary embolism or ACS ? ?Patient is a 41 year old female who presents with 2 complaints today headache and chest pain.  Headache has been going on for several months it is intermittent described as sharp lasting for 2 minutes at a time without associated red flags of no neurologic symptoms fevers or neck pain.  Patient does really does not have a history of prior similar headaches before this.  Notably she has an appointment with a neurologist tomorrow.  Her neurologic exam is intact she is not having headache currently so I am reassured.  Agree with neurology follow-up. ? ?As of her  chest pain this is also been going on for several weeks.  It is intermittent sharp nonpleuritic nonexertional without clear exacerbating alleviating factors.  Overall sounds atypical for ischemia.  Her EKG is not have acute ischemic changes she has not had the chest pain since yesterday.  We will send screening labs including a troponin however I am reassured with her normal vital signs and subacute presentation.  Will advise PCP follow-up with potential referral to cardiology for stress testing. ? ?Trop is negative. CBC and bmp otherwise reassuring, will dc with outpt follow-up.  ? ?  ? ? ?FINAL CLINICAL IMPRESSION(S) / ED DIAGNOSES  ? ?Final diagnoses:  ?Chest pain, unspecified type  ?Nonintractable headache, unspecified chronicity pattern, unspecified headache type  ? ? ? ?Rx / DC Orders  ? ?ED Discharge Orders   ? ? None  ? ?  ? ? ? ?Note:  This document was prepared using Dragon voice recognition software and may include unintentional dictation errors. ?  ?Georga Hacking, MD ?11/20/21 1832 ? ?

## 2021-11-21 ENCOUNTER — Ambulatory Visit: Payer: Medicaid Other

## 2021-12-04 ENCOUNTER — Ambulatory Visit: Payer: Medicaid Other

## 2021-12-27 ENCOUNTER — Ambulatory Visit: Payer: Medicaid Other | Admitting: Nurse Practitioner

## 2021-12-27 ENCOUNTER — Encounter: Payer: Self-pay | Admitting: Nurse Practitioner

## 2021-12-27 DIAGNOSIS — Z113 Encounter for screening for infections with a predominantly sexual mode of transmission: Secondary | ICD-10-CM

## 2021-12-27 LAB — WET PREP FOR TRICH, YEAST, CLUE
Trichomonas Exam: NEGATIVE
Yeast Exam: NEGATIVE

## 2021-12-27 LAB — HM HEPATITIS C SCREENING LAB: HM Hepatitis Screen: NEGATIVE

## 2021-12-27 LAB — HM HIV SCREENING LAB: HM HIV Screening: NEGATIVE

## 2021-12-27 LAB — HEPATITIS B SURFACE ANTIGEN

## 2021-12-27 NOTE — Progress Notes (Signed)
[facility] Department ? ?STI clinic/screening visit ?[place] [address] ?[phone] ? ?Subjective:  ?Lacey Tanner is a 41 y.o. female being seen today for an STI screening visit. The patient reports they do have symptoms.  Patient reports that they do not desire a pregnancy in the next year.   They reported they are not interested in discussing contraception today.  Patient had a tubal ligation.   ? ?Patient's last menstrual period was 12/07/2021 (exact date). ? ? ?Patient has the following medical conditions:  There are no problems to display for this patient. ? ? ?Chief Complaint  ?Patient presents with  ? SEXUALLY TRANSMITTED DISEASE  ? ? ?HPI ? ?Patient reports to clinic today for STD screening.  Patient reports that for one week she has experienced genital itching, dysuria, and irritation.  ? ?Last HIV test per patient/review of record was 3 months ago. ?Patient reports last pap was 3 months ago. ? ?Screening for MPX risk: ?Does the patient have an unexplained rash? No ?Is the patient MSM? No ?Does the patient endorse multiple sex partners or anonymous sex partners? No ?Did the patient have close or sexual contact with a person diagnosed with MPX? No ?Has the patient traveled outside the [place] where MPX is endemic? No ?Is there a high clinical suspicion for MPX-- evidenced by one of the following No ? -Unlikely to be chickenpox ? -Lymphadenopathy ? -Rash that present in same phase of evolution on any given body part ?See flowsheet for further details and programmatic requirements.  ? ? ?The following portions of the patient's history were reviewed and updated as appropriate: allergies, current medications, past medical history, past social history, past surgical history and problem list. ? ?Objective:  ?There were no vitals filed for this visit. ? ?Physical Exam ?Constitutional:   ?   Appearance: Normal appearance.  ?HENT:  ?   Head: Normocephalic.  ?   Right Ear:  External ear normal.  ?   Left Ear: External ear normal.  ?   Nose: Nose normal.  ?   Mouth/Throat:  ?   Lips: Pink.  ?   Mouth: Mucous membranes are moist.  ?   Comments: No visible signs of dental caries.  Dentition missing.  ?Pulmonary:  ?   Effort: Pulmonary effort is normal.  ?Abdominal:  ?   General: Abdomen is flat.  ?   Palpations: Abdomen is soft.  ?Genitourinary: ?   Comments: External genitalia/pubic area without nits, lice, edema, erythema, lesions and inguinal adenopathy. ?Vagina with normal mucosa and discharge. ?Cervix without visible lesions. ?Uterus firm, mobile, nt, no masses, no CMT, no adnexal tenderness or fullness. pH . 4.5  ?Small hemorrhoid noted to rectum  ?Musculoskeletal:  ?   Cervical back: Full passive range of motion without pain, normal range of motion and neck supple.  ?Skin: ?   General: Skin is warm and dry.  ?Neurological:  ?   Mental Status: She is alert and oriented to person, place, and time.  ?Psychiatric:     ?   Attention and Perception: Attention normal.     ?   Mood and Affect: Mood normal.     ?   Speech: Speech normal.     ?   Behavior: Behavior is cooperative.  ? ? ? ?Assessment and Plan:  ?Lacey Tanner is a 41 y.o. female presenting to the [facility] Department for STI screening ? ? ?1. Screening examination for venereal disease ?-  41 year old female in clinic today for STD screening. ?-Patient accepted all screenings including oral, vaginal CT/GC and bloodwork for HIV/RPR, HBV/HCV ?Patient meets criteria for HepB screening? Yes. Ordered? Yes ?Patient meets criteria for HepC screening? Yes. Ordered? Yes ? ?Treat wet prep per standing order ?Discussed time line for State Lab results and that patient will be called with positive results and encouraged patient to call if she had not heard in 2 weeks.  ?Counseled to return or seek care for continued or worsening symptoms ?Recommended condom use with all sex ? ?Patient is currently using  sterilization  to  prevent pregnancy.   ? ?- HIV/HCV [place] [facility] ?- Syphilis Serology, [facility] ?- Chlamydia/Gonorrhea  Lab ?- HBV Antigen/Antibody State Lab ?- WET PREP FOR TRICH, YEAST, CLUE ?- Gonococcus culture ? ? ? ? ? ?Return if symptoms worsen or fail to improve. ? ? ?Gregary Cromer, FNP ?

## 2021-12-27 NOTE — Progress Notes (Signed)
Pt here for STI screening. Wet Prep negative. Condoms declined.  Keitha Butte RN ?

## 2021-12-31 LAB — GONOCOCCUS CULTURE

## 2022-02-02 ENCOUNTER — Other Ambulatory Visit: Payer: Self-pay | Admitting: Neurology

## 2022-02-02 DIAGNOSIS — G43019 Migraine without aura, intractable, without status migrainosus: Secondary | ICD-10-CM

## 2022-02-16 ENCOUNTER — Ambulatory Visit: Payer: Medicaid Other | Attending: Internal Medicine

## 2022-02-16 DIAGNOSIS — G4733 Obstructive sleep apnea (adult) (pediatric): Secondary | ICD-10-CM | POA: Diagnosis present

## 2022-02-16 DIAGNOSIS — G4761 Periodic limb movement disorder: Secondary | ICD-10-CM | POA: Diagnosis not present

## 2022-02-16 DIAGNOSIS — Z6834 Body mass index (BMI) 34.0-34.9, adult: Secondary | ICD-10-CM | POA: Diagnosis not present

## 2022-02-22 ENCOUNTER — Inpatient Hospital Stay: Admission: RE | Admit: 2022-02-22 | Payer: Medicaid Other | Source: Ambulatory Visit

## 2022-03-08 ENCOUNTER — Ambulatory Visit
Admission: RE | Admit: 2022-03-08 | Discharge: 2022-03-08 | Disposition: A | Discharge status: Home / Self Care | Payer: Medicaid Other | Source: Ambulatory Visit | Attending: Neurology | Admitting: Neurology

## 2022-03-08 DIAGNOSIS — G43019 Migraine without aura, intractable, without status migrainosus: Secondary | ICD-10-CM

## 2022-03-08 MED ORDER — GADOBENATE DIMEGLUMINE 529 MG/ML IV SOLN
20.0000 mL | Freq: Once | INTRAVENOUS | Status: AC | PRN
Start: 2022-03-08 — End: 2022-03-08
  Administered 2022-03-08: 20 mL via INTRAVENOUS

## 2022-09-18 ENCOUNTER — Ambulatory Visit: Payer: Medicaid Other

## 2022-10-04 ENCOUNTER — Ambulatory Visit: Payer: Medicaid Other

## 2022-12-10 ENCOUNTER — Ambulatory Visit: Payer: Medicaid Other

## 2023-01-01 ENCOUNTER — Other Ambulatory Visit: Payer: Self-pay | Admitting: Family Medicine

## 2023-01-01 DIAGNOSIS — R102 Pelvic and perineal pain: Secondary | ICD-10-CM

## 2023-01-17 ENCOUNTER — Ambulatory Visit: Admission: RE | Admit: 2023-01-17 | Payer: Medicaid Other | Source: Ambulatory Visit

## 2023-02-14 DIAGNOSIS — R1011 Right upper quadrant pain: Secondary | ICD-10-CM | POA: Diagnosis not present

## 2023-02-14 DIAGNOSIS — Z202 Contact with and (suspected) exposure to infections with a predominantly sexual mode of transmission: Secondary | ICD-10-CM | POA: Diagnosis not present

## 2023-02-14 DIAGNOSIS — R102 Pelvic and perineal pain: Secondary | ICD-10-CM | POA: Diagnosis not present

## 2023-03-13 ENCOUNTER — Telehealth: Payer: Self-pay | Admitting: Obstetrics and Gynecology

## 2023-03-13 ENCOUNTER — Ambulatory Visit: Payer: 59 | Admitting: Obstetrics and Gynecology

## 2023-03-13 NOTE — Progress Notes (Deleted)
    GYNECOLOGY OFFICE COLPOSCOPY PROCEDURE NOTE  42 y.o. No obstetric history on file. here for colposcopy for low-grade squamous intraepithelial neoplasia (LGSIL - encompassing HPV,mild dysplasia,CIN I) pap smear on 02/25/2023. Discussed role for HPV in cervical dysplasia, need for surveillance.  Patient gave informed written consent, time out was performed.  Placed in lithotomy position. Cervix viewed with speculum and colposcope after application of acetic acid.   Colposcopy adequate? {yes/no:20286}  {Findings; colposcopy:728}; corresponding biopsies obtained.  ECC specimen obtained. All specimens were labeled and sent to pathology.  Chaperone was present during entire procedure.  Patient was given post procedure instructions.  Will follow up pathology and manage accordingly; patient will be contacted with results and recommendations.  Routine preventative health maintenance measures emphasized.    Hildred Laser, MD [facility]

## 2023-03-13 NOTE — Telephone Encounter (Signed)
Reached out to pt to reschedule colpo appt that was scheduled on 03/13/2023 with Dr. Valentino Saxon at 10:15.  Was able to reschedule appt with Dr. Marice Potter on 04/29/2023 at 10:15.

## 2023-03-25 ENCOUNTER — Emergency Department
Admission: EM | Admit: 2023-03-25 | Discharge: 2023-03-25 | Disposition: A | Discharge status: Home / Self Care | Payer: 59 | Attending: Emergency Medicine | Admitting: Emergency Medicine

## 2023-03-25 ENCOUNTER — Other Ambulatory Visit: Payer: Self-pay

## 2023-03-25 DIAGNOSIS — N390 Urinary tract infection, site not specified: Secondary | ICD-10-CM | POA: Diagnosis not present

## 2023-03-25 DIAGNOSIS — R102 Pelvic and perineal pain: Secondary | ICD-10-CM | POA: Diagnosis not present

## 2023-03-25 DIAGNOSIS — Z113 Encounter for screening for infections with a predominantly sexual mode of transmission: Secondary | ICD-10-CM | POA: Diagnosis not present

## 2023-03-25 LAB — CHLAMYDIA/NGC RT PCR (ARMC ONLY)
Chlamydia Tr: NOT DETECTED
N gonorrhoeae: DETECTED — AB

## 2023-03-25 LAB — WET PREP, GENITAL
Clue Cells Wet Prep HPF POC: NONE SEEN
Sperm: NONE SEEN
Trich, Wet Prep: NONE SEEN
WBC, Wet Prep HPF POC: >=10 — AB (ref <10)
Yeast Wet Prep HPF POC: NONE SEEN

## 2023-03-25 LAB — URINALYSIS, ROUTINE W REFLEX MICROSCOPIC
Bilirubin Urine: NEGATIVE
Glucose, UA: NEGATIVE mg/dL
Ketones, ur: NEGATIVE mg/dL
Nitrite: NEGATIVE
Protein, ur: 100 mg/dL — AB
RBC / HPF: >50 RBC/hpf (ref 0–5)
Specific Gravity, Urine: 1.026 (ref 1.005–1.030)
WBC, UA: >50 WBC/hpf (ref 0–5)
pH: 5 (ref 5.0–8.0)

## 2023-03-25 LAB — PREGNANCY, URINE: Preg Test, Ur: NEGATIVE

## 2023-03-25 MED ORDER — PHENAZOPYRIDINE HCL 100 MG PO TABS: 100.0000 mg | ORAL_TABLET | Freq: Three times a day (TID) | ORAL | 0 refills | Status: AC

## 2023-03-25 MED ORDER — PHENAZOPYRIDINE HCL 200 MG PO TABS
200.0000 mg | ORAL_TABLET | Freq: Once | ORAL | Status: AC
  Administered 2023-03-25: 200 mg via ORAL
  Filled 2023-03-25: qty 1

## 2023-03-25 MED ORDER — CEFDINIR 300 MG PO CAPS: 300.0000 mg | ORAL_CAPSULE | Freq: Two times a day (BID) | ORAL | 0 refills | Status: DC

## 2023-03-25 MED ORDER — LIDOCAINE HCL (PF) 1 % IJ SOLN
INTRAMUSCULAR | Status: AC
  Administered 2023-03-25: 5 mL
  Filled 2023-03-25: qty 5

## 2023-03-25 MED ORDER — CEFTRIAXONE SODIUM 1 G IJ SOLR
500.0000 mg | Freq: Once | INTRAMUSCULAR | Status: AC
Start: 2023-03-25 — End: 2023-03-25
  Administered 2023-03-25: 500 mg via INTRAMUSCULAR
  Filled 2023-03-25: qty 10

## 2023-03-25 MED ORDER — PHENAZOPYRIDINE HCL 100 MG PO TABS: 100.0000 mg | ORAL_TABLET | Freq: Three times a day (TID) | ORAL | 0 refills | Status: DC

## 2023-03-25 NOTE — ED Triage Notes (Signed)
Pt to ED for pelvic pain started Saturday. Now reports white vaginal discharge, states thinks has STD. Pt currently on menstrual cycle

## 2023-03-25 NOTE — ED Provider Notes (Signed)
[facility] Emergency Department Provider Note     Event Date/Time   First MD Initiated Contact with Patient 03/25/23 1400     (approximate)   History   Abdominal Pain   HPI  Lacey Tanner is a 42 y.o. female presents to the ED with complaint of pelvic pain x  2 days.  Patient describes pain as localized to suprapubic, intermittent and sharp.  No radiation. endorses possible STD given sexual intercourse with 2 new partners. Patient has a history of STDs.  Patient endorses vaginal discharge of abnormal color and smell.  Patient reports history of trichomoniasis.  Denies fever, vaginal bleeding, and vaginal itching.  Unknown pregnancy status and LMP.    Physical Exam   Triage Vital Signs: ED Triage Vitals  Encounter Vitals Group     BP 03/25/23 1343 (!) 164/92     Systolic BP Percentile --      Diastolic BP Percentile --      Pulse Rate 03/25/23 1343 80     Resp 03/25/23 1343 16     Temp 03/25/23 1341 97.9 F (36.6 C)     Temp src --      SpO2 03/25/23 1343 100 %     Weight 03/25/23 1343 230 lb (104.3 kg)     Height 03/25/23 1343 5\' 8"  (1.727 m)     Head Circumference --      Peak Flow --      Pain Score 03/25/23 1343 10     Pain Loc --      Pain Education --      Exclude from Growth Chart --    Most recent vital signs: Vitals:   03/25/23 1341 03/25/23 1343  BP:  (!) 164/92  Pulse:  80  Resp:  16  Temp: 97.9 F (36.6 C)   SpO2:  100%    General Awake, no distress.  HEENT NCAT. PERRL. EOMI. No rhinorrhea. Mucous membranes are moist.  CV:  Good peripheral perfusion.  RESP:  Normal effort.  ABD:  No distention.  Soft and nontender.  Suprapubic tenderness with deep palpation.  No CVA tenderness bilaterally. Other:   GU exam performed.  No external lesions noted.  There is mild brown and reddish discharge.  Pelvic exam reveals mucopurulent discharge and 3 various size circular lesions on cervix.  Bimanual exam performed.  No cervical  motion tenderness.  Ovaries are not enlarged.   ED Results / Procedures / Treatments   Labs (all labs ordered are listed, but only abnormal results are displayed) Labs Reviewed  CHLAMYDIA/NGC RT PCR ([facility] ONLY)           - Abnormal; Notable for the following components:      Result Value   N gonorrhoeae DETECTED (*)    All other components within normal limits  WET PREP, GENITAL - Abnormal; Notable for the following components:   WBC, Wet Prep HPF POC >=10 (*)    All other components within normal limits  URINALYSIS, ROUTINE W REFLEX MICROSCOPIC - Abnormal; Notable for the following components:   Color, Urine AMBER (*)    APPearance TURBID (*)    Hgb urine dipstick SMALL (*)    Protein, ur 100 (*)    Leukocytes,Ua LARGE (*)    Bacteria, UA MANY (*)    All other components within normal limits  PREGNANCY, URINE   No results found.  PROCEDURES:  Critical Care performed: No  Procedures  MEDICATIONS ORDERED IN  ED: Medications  phenazopyridine (PYRIDIUM) tablet 200 mg (200 mg Oral Given 03/25/23 1650)  cefTRIAXone (ROCEPHIN) injection 500 mg (500 mg Intramuscular Given 03/25/23 1646)  lidocaine (PF) (XYLOCAINE) 1 % injection (5 mLs  Given 03/25/23 1647)     IMPRESSION / MDM / ASSESSMENT AND PLAN / ED COURSE  I reviewed the triage vital signs and the nursing notes.                              Clinical Course as of 03/25/23 Ines Bloomer Mar 25, 2023  7371 Patient [MH]  0626 Notified patient on detected gonorrhea via phone call. [MH]    Clinical Course User Index [MH] Kern Reap A, PA-C    42 y.o. female presents to the emergency department for evaluation and treatment of STD exposure and suprapubic pain. See HPI for further details.   Differential diagnosis includes, but is not limited to chlamydia, gonorrhea, STI, UTI, PID.  Pregnancy urine is negative. Pelvic exam performed abnormal findings noted above. Wet Prep reveals WBC. Urinalysis is clinically consistent  with a urinary tract infection. Chlamydia and gonorrhea are pending. Patient request to be called with results. Given patients history, I will empirically treat patient with Rocephin in ED. Will call patient if positive results. Patient given Pyridium in ED for bladder pain. Patient is in stable condition for discharge home. She will be prescribed OMNICEF and Pyridium. Patient is given ED precautions to return to the ED for any worsening or new symptoms. Patient verbalizes understanding. All questions and concerns were addressed during ED visit.    ----------------------------------------- 6:55 PM on 03/25/2023 -----------------------------------------   Gonnorhea is positive. Patient treated with rocephin injection in ED. Called patient and notified her of the results. Patient verbalized understanding. Patient will need to follow up with OBGYN for abnormal pelvic exam inspection for further management. She has a scheduled colpo procedure on 8/26. I encouraged her to go to this appointment if she can not get into the office sooner.   Patient's presentation is most consistent with acute illness / injury with system symptoms.  FINAL CLINICAL IMPRESSION(S) / ED DIAGNOSES   Final diagnoses:  Lower urinary tract infectious disease  Screen for STD (sexually transmitted disease)    Rx / DC Orders   ED Discharge Orders          Ordered    cefdinir (OMNICEF) 300 MG capsule  2 times daily,   Status:  Discontinued        03/25/23 1634    phenazopyridine (PYRIDIUM) 100 MG tablet  3 times daily,   Status:  Discontinued        03/25/23 1634    cefdinir (OMNICEF) 300 MG capsule  2 times daily        03/25/23 1636    phenazopyridine (PYRIDIUM) 100 MG tablet  3 times daily        03/25/23 1636             Note:  This document was prepared using Dragon voice recognition software and may include unintentional dictation errors.    Romeo Apple, Chela Sutphen A, PA-C 03/26/23 1706    Jene Every,  MD 03/28/23 [phone]

## 2023-03-25 NOTE — Discharge Instructions (Addendum)
Avoid sex for 7 days to prevent transmission Rescreen in 3 months Continue to go to appointment with Dr Marice Potter on 08/26 - OBGYN

## 2023-03-26 ENCOUNTER — Telehealth: Payer: Self-pay | Admitting: Emergency Medicine

## 2023-03-26 NOTE — Telephone Encounter (Signed)
Called patient to notify of std results.  Left message with my number to call me back.

## 2023-03-27 ENCOUNTER — Telehealth: Payer: Self-pay | Admitting: Obstetrics & Gynecology

## 2023-03-27 NOTE — Telephone Encounter (Signed)
I contacted the patient via phone after our call dropped. I leave generic message advising the patient to call back for scheduling.

## 2023-03-27 NOTE — Telephone Encounter (Signed)
The patient is scheduled for 7/30 with Lacey Tanner for 9:35 am

## 2023-03-27 NOTE — Telephone Encounter (Signed)
The patient was calling our office to scheduled follow up care from an ER visit on 03/25/23. The call was dropped when advising opening for opening for new patient appointment with Guadlupe Spanish on Thursday, 03/27/23 at 2:55 pm. The patient has an appointment scheduled for referral with Dr. Marice Potter for Colposcopy / STD. I was unable to move that appointment up for the patient and she is aware we would schedule for ER follow up and keep that appointment for 8/26 were it is.

## 2023-03-27 NOTE — Telephone Encounter (Signed)
I have contacted the patient via phone. Lacey Tanner is scheduled for 7/26 at 9:55 with Hartley Barefoot

## 2023-03-29 ENCOUNTER — Encounter: Payer: Self-pay | Admitting: Certified Nurse Midwife

## 2023-03-29 ENCOUNTER — Ambulatory Visit (INDEPENDENT_AMBULATORY_CARE_PROVIDER_SITE_OTHER): Payer: 59 | Admitting: Certified Nurse Midwife

## 2023-03-29 VITALS — BP 119/75 | HR 67 | Ht 68.0 in | Wt 200.0 lb

## 2023-03-29 DIAGNOSIS — R300 Dysuria: Secondary | ICD-10-CM

## 2023-03-29 DIAGNOSIS — R3989 Other symptoms and signs involving the genitourinary system: Secondary | ICD-10-CM

## 2023-03-29 DIAGNOSIS — A64 Unspecified sexually transmitted disease: Secondary | ICD-10-CM | POA: Diagnosis not present

## 2023-03-29 MED ORDER — PHENAZOPYRIDINE HCL 200 MG PO TABS: 200.0000 mg | ORAL_TABLET | Freq: Three times a day (TID) | ORAL | 1 refills | Status: DC | PRN

## 2023-03-29 MED ORDER — SULFAMETHOXAZOLE-TRIMETHOPRIM 800-160 MG PO TABS: 1.0000 | ORAL_TABLET | Freq: Two times a day (BID) | ORAL | 0 refills | Status: AC

## 2023-03-29 MED ORDER — IBUPROFEN 800 MG PO TABS: 800.0000 mg | ORAL_TABLET | Freq: Three times a day (TID) | ORAL | 1 refills | Status: DC | PRN

## 2023-03-29 NOTE — Progress Notes (Signed)
Sandrea Hughs, NP   Chief Complaint  Patient presents with   Follow-up    Pelvic pain  Vaginal discharge  States ER seen white spots in cervix    HPI:      Lacey Tanner is a 42 y.o. [id number] whose LMP was Patient's last menstrual period was 03/25/2023., presents today for follow up from ED visit. She was seen in the ED at [facility] 7/22 and presumptively treated with Rocephin for STI concerns as well as given Cefdinir for presumptive UTI on UA. She reports today she continues to have lower abdominal pain. She is requesting pain medication.     There are no problems to display for this patient.   History reviewed. No pertinent surgical history.  History reviewed. No pertinent family history.  Social History   Socioeconomic History   Marital status: Single    Spouse name: Not on file   Number of children: Not on file   Years of education: Not on file   Highest education level: Not on file  Occupational History   Not on file  Tobacco Use   Smoking status: Some Days    Types: Cigarettes   Smokeless tobacco: Never   Tobacco comments:    Smokes a cigarettes only when she drinks liquor, which is occasionally.    Vaping Use   Vaping status: Never Used  Substance and Sexual Activity   Alcohol use: Yes    Alcohol/week: 1.0 standard drink of alcohol    Types: 1 Cans of beer per week    Comment: one beer daily   Drug use: Yes    Types: Marijuana, Cocaine    Comment: every couple of months   Sexual activity: Yes    Birth control/protection: Surgical  Other Topics Concern   Not on file  Social History Narrative   Not on file   Social Determinants of Health   Financial Resource Strain: Not on file  Food Insecurity: Not on file  Transportation Needs: Not on file  Physical Activity: Not on file  Stress: Not on file  Social Connections: Not on file  Intimate Partner Violence: Not At Risk (12/27/2021)   Humiliation, Afraid, Rape, and Kick questionnaire    Fear of  Current or Ex-Partner: No    Emotionally Abused: No    Physically Abused: No    Sexually Abused: No    Outpatient Medications Prior to Visit  Medication Sig Dispense Refill   cefdinir (OMNICEF) 300 MG capsule Take 1 capsule (300 mg total) by mouth 2 (two) times daily. 14 capsule 0   hydrochlorothiazide (HYDRODIURIL) 12.5 MG tablet Take 1 tablet (12.5 mg total) by mouth daily. 30 tablet 0   lisinopril (ZESTRIL) 20 MG tablet Take 25 mg by mouth daily.     methocarbamol (ROBAXIN) 500 MG tablet Take 1 tablet (500 mg total) by mouth 4 (four) times daily. 12 tablet 0   traMADol (ULTRAM) 50 MG tablet Take 1 tablet (50 mg total) every 6 (six) hours as needed by mouth for moderate pain. 12 tablet 0   HYDROcodone-acetaminophen (NORCO) 5-325 MG tablet Take 1 tablet by mouth every 6 (six) hours as needed for moderate pain. (Patient not taking: Reported on 03/29/2023) 15 tablet 0   No facility-administered medications prior to visit.      ROS:  Review of Systems  Constitutional: Negative.   Respiratory: Negative.    Cardiovascular: Negative.   Genitourinary:  Positive for pelvic pain and vaginal discharge.  OBJECTIVE:   Vitals:  BP 119/75   Pulse 67   Ht 5\' 8"  (1.727 m)   Wt 200 lb (90.7 kg)   LMP 03/25/2023   BMI 30.41 kg/m   Physical Exam Constitutional:      Appearance: Normal appearance.  Cardiovascular:     Rate and Rhythm: Normal rate.  Pulmonary:     Effort: Pulmonary effort is normal.  Abdominal:     Tenderness: There is abdominal tenderness in the suprapubic area. There is no right CVA tenderness or left CVA tenderness.  Genitourinary:    General: Normal vulva.     Vagina: Vaginal discharge present.     Cervix: No friability, erythema or cervical bleeding.  Neurological:     Mental Status: She is alert.     Results: No results found for this or any previous visit (from the past 24 hour(s)).   Assessment/Plan: Bladder pain  Dysuria - Plan: Urine  Culture  STI (sexually transmitted infection)  Antibiotic changed from Cefdinir to Bactrim, Urine culture collected today. Stop cefdinir & start bactrim. Request for narcotic pain medication denied, ibuprofen offered and accepted. Pyridium refilled. Signs of ascending infection including fever, chills, flank pain reviewed.  Meds ordered this encounter  Medications   sulfamethoxazole-trimethoprim (BACTRIM DS) 800-160 MG tablet    Sig: Take 1 tablet by mouth 2 (two) times daily for 7 days.    Dispense:  14 tablet    Refill:  0    Order Specific Question:   Supervising Provider    Answer:   Hildred Laser [[id number]]   phenazopyridine (PYRIDIUM) 200 MG tablet    Sig: Take 1 tablet (200 mg total) by mouth 3 (three) times daily as needed for pain (urethral spasm).    Dispense:  15 tablet    Refill:  1    Order Specific Question:   Supervising Provider    Answer:   Hildred Laser [[id number]]   ibuprofen (ADVIL) 800 MG tablet    Sig: Take 1 tablet (800 mg total) by mouth every 8 (eight) hours as needed for cramping.    Dispense:  30 tablet    Refill:  1    Order Specific Question:   Supervising Provider    Answer:   Hildred Laser [[id number]]     Dominica Severin, CNM 03/29/2023 11:24 AM

## 2023-04-01 ENCOUNTER — Telehealth: Payer: Self-pay | Admitting: Certified Nurse Midwife

## 2023-04-01 MED ORDER — FLUCONAZOLE 150 MG PO TABS: 150.0000 mg | ORAL_TABLET | ORAL | 0 refills | Status: AC

## 2023-04-01 MED ORDER — METRONIDAZOLE 500 MG PO TABS: 500.0000 mg | ORAL_TABLET | Freq: Two times a day (BID) | ORAL | 0 refills | Status: AC

## 2023-04-01 NOTE — Telephone Encounter (Signed)
TC to [place] to follow up after Friday visit. Reviewed Urine culture returned negative, did not pick up Bactrim-has continued Cefdinir. Recommended complete course that was prescribed in ED and do not start Bactrim. Reports itching/irritation to labia & vagina. Flagyl & diflucan prescribed given recent courses of antibiotics.

## 2023-04-02 ENCOUNTER — Ambulatory Visit: Payer: 59 | Admitting: Certified Nurse Midwife

## 2023-04-09 DIAGNOSIS — A549 Gonococcal infection, unspecified: Secondary | ICD-10-CM | POA: Diagnosis not present

## 2023-04-29 ENCOUNTER — Other Ambulatory Visit (HOSPITAL_COMMUNITY)
Admission: RE | Admit: 2023-04-29 | Discharge: 2023-04-29 | Disposition: A | Discharge status: Home / Self Care | Payer: 59 | Source: Ambulatory Visit | Attending: Obstetrics & Gynecology | Admitting: Obstetrics & Gynecology

## 2023-04-29 ENCOUNTER — Encounter: Payer: Self-pay | Admitting: Obstetrics & Gynecology

## 2023-04-29 ENCOUNTER — Ambulatory Visit (INDEPENDENT_AMBULATORY_CARE_PROVIDER_SITE_OTHER): Payer: 59 | Admitting: Obstetrics & Gynecology

## 2023-04-29 VITALS — BP 157/113 | HR 76 | Ht 68.0 in | Wt 194.0 lb

## 2023-04-29 DIAGNOSIS — A549 Gonococcal infection, unspecified: Secondary | ICD-10-CM | POA: Insufficient documentation

## 2023-04-29 DIAGNOSIS — R87612 Low grade squamous intraepithelial lesion on cytologic smear of cervix (LGSIL): Secondary | ICD-10-CM

## 2023-04-29 DIAGNOSIS — N870 Mild cervical dysplasia: Secondary | ICD-10-CM | POA: Diagnosis not present

## 2023-04-29 DIAGNOSIS — Z113 Encounter for screening for infections with a predominantly sexual mode of transmission: Secondary | ICD-10-CM

## 2023-04-29 NOTE — Progress Notes (Signed)
   Established Patient Office Visit  Subjective   Patient ID: Lacey Tanner, female    DOB: 1981/06/24  Age: 42 y.o. MRN: [id number]  Chief Complaint  Patient presents with   Colposcopy    Bad periods     HPI   42 yo single P4 here as a referral from [facility] for a LGSIL pap. She also needs TOC for + GC last month. She reports that she has not had sex with either of the 2 men that she was having sex with when she was diagnosed with GC. She has a new partner. She would like STI testing.  She has a h/o painful periods and would like an appt to discuss this issue. Objective:     BP (!) 157/113   Pulse 76   Ht 5\' 8"  (1.727 m)   Wt 194 lb (88 kg)   LMP 04/11/2023   BMI 29.50 kg/m    Physical Exam   Well nourished, well hydrated Black female, no apparent distress She is ambulating normally. Based on the way she is conversing, I would deem her to be under the influence of THC. Of note, she was 45 minutes late for her appt.  Consent signed, time out done Cervix prepped with acetic acid. Transformation zone seen in its entirety. Colpo adequate. There was a tiny area of acetowhite changes at the 5 o'clock position of her cervix. I biopsied this area. ECC obtained. She tolerated the procedure well.   Assessment & Plan:   Problem List Items Addressed This Visit   None Visit Diagnoses     Gonorrhea    -  Primary   Relevant Orders   Cervicovaginal ancillary only   LGSIL on Pap smear of cervix       Routine screening for STI (sexually transmitted infection)       Relevant Orders   HIV antibody (with reflex)   Hepatitis B surface antigen   Hepatitis C Antibody   RPR      She will come back to discuss the results of her pathology and dysmenorrhea.   Allie Bossier, MD

## 2023-04-30 LAB — HEPATITIS B SURFACE ANTIGEN: Hepatitis B Surface Ag: NEGATIVE

## 2023-04-30 LAB — HEPATITIS C ANTIBODY: Hep C Virus Ab: NONREACTIVE

## 2023-04-30 LAB — RPR: RPR Ser Ql: NONREACTIVE

## 2023-04-30 LAB — HIV ANTIBODY (ROUTINE TESTING W REFLEX): HIV Screen 4th Generation wRfx: NONREACTIVE

## 2023-05-01 LAB — SURGICAL PATHOLOGY

## 2023-05-01 LAB — CERVICOVAGINAL ANCILLARY ONLY
Chlamydia: NEGATIVE
Comment: NEGATIVE
Comment: NORMAL
Neisseria Gonorrhea: NEGATIVE

## 2023-05-02 ENCOUNTER — Telehealth: Payer: Self-pay

## 2023-05-02 NOTE — Telephone Encounter (Signed)
Pt calling; has seen her results and is concerned.  Please call.  [phone]

## 2023-05-06 DIAGNOSIS — Z113 Encounter for screening for infections with a predominantly sexual mode of transmission: Secondary | ICD-10-CM | POA: Diagnosis not present

## 2023-05-06 DIAGNOSIS — R1030 Lower abdominal pain, unspecified: Secondary | ICD-10-CM | POA: Diagnosis not present

## 2023-05-09 ENCOUNTER — Encounter: Payer: Self-pay | Admitting: Obstetrics & Gynecology

## 2023-05-13 DIAGNOSIS — Z113 Encounter for screening for infections with a predominantly sexual mode of transmission: Secondary | ICD-10-CM | POA: Diagnosis not present

## 2023-05-13 DIAGNOSIS — Z1159 Encounter for screening for other viral diseases: Secondary | ICD-10-CM | POA: Diagnosis not present

## 2023-05-13 DIAGNOSIS — Z202 Contact with and (suspected) exposure to infections with a predominantly sexual mode of transmission: Secondary | ICD-10-CM | POA: Diagnosis not present

## 2023-05-22 ENCOUNTER — Telehealth: Payer: Self-pay

## 2023-05-22 NOTE — Telephone Encounter (Signed)
Pt called for Dr Marice Potter to go over colpo results. Pt aware you will call when back in office.

## 2023-05-27 ENCOUNTER — Ambulatory Visit: Payer: 59 | Admitting: Obstetrics & Gynecology

## 2023-06-04 ENCOUNTER — Ambulatory Visit (INDEPENDENT_AMBULATORY_CARE_PROVIDER_SITE_OTHER): Payer: 59 | Admitting: Obstetrics & Gynecology

## 2023-06-04 ENCOUNTER — Other Ambulatory Visit (HOSPITAL_COMMUNITY)
Admission: RE | Admit: 2023-06-04 | Discharge: 2023-06-04 | Disposition: A | Discharge status: Home / Self Care | Payer: 59 | Source: Ambulatory Visit | Attending: Obstetrics & Gynecology | Admitting: Obstetrics & Gynecology

## 2023-06-04 ENCOUNTER — Encounter: Payer: Self-pay | Admitting: Obstetrics & Gynecology

## 2023-06-04 VITALS — BP 136/76 | HR 72 | Ht 68.0 in | Wt 203.0 lb

## 2023-06-04 DIAGNOSIS — N946 Dysmenorrhea, unspecified: Secondary | ICD-10-CM

## 2023-06-04 DIAGNOSIS — N870 Mild cervical dysplasia: Secondary | ICD-10-CM | POA: Diagnosis not present

## 2023-06-04 DIAGNOSIS — Z113 Encounter for screening for infections with a predominantly sexual mode of transmission: Secondary | ICD-10-CM

## 2023-06-04 DIAGNOSIS — R300 Dysuria: Secondary | ICD-10-CM

## 2023-06-04 DIAGNOSIS — N898 Other specified noninflammatory disorders of vagina: Secondary | ICD-10-CM

## 2023-06-04 DIAGNOSIS — Z30430 Encounter for insertion of intrauterine contraceptive device: Secondary | ICD-10-CM

## 2023-06-04 LAB — POCT URINALYSIS DIPSTICK
Bilirubin, UA: NEGATIVE
Blood, UA: NEGATIVE
Glucose, UA: NEGATIVE
Ketones, UA: NEGATIVE
Leukocytes, UA: NEGATIVE
Nitrite, UA: NEGATIVE
Protein, UA: NEGATIVE
Spec Grav, UA: 1.01 (ref 1.010–1.025)
Urobilinogen, UA: 1 U/dL
pH, UA: 6 (ref 5.0–8.0)

## 2023-06-04 MED ORDER — METRONIDAZOLE 500 MG PO TABS: 500.0000 mg | ORAL_TABLET | Freq: Two times a day (BID) | ORAL | 0 refills | Status: DC

## 2023-06-04 MED ORDER — LEVONORGESTREL 20 MCG/DAY IU IUD
1.0000 | INTRAUTERINE_SYSTEM | Freq: Once | INTRAUTERINE | Status: AC
Start: 2023-06-04 — End: 2023-06-04
  Administered 2023-06-04: 1 via INTRAUTERINE

## 2023-06-04 NOTE — Addendum Note (Signed)
Addended by: Cornelius Moras D on: 06/04/2023 11:13 AM   Modules accepted: Orders

## 2023-06-04 NOTE — Progress Notes (Signed)
    GYNECOLOGY PROGRESS NOTE  Subjective:    Patient ID: Lacey Tanner, female    DOB: 11-16-1980, 42 y.o.   MRN: [id number]  HPI  Patient is a 42 y.o. single G20P4004 female who presents for discussion of her pathology results from colpo last month and to discuss dysmenorrhea. She reports menarche at 42 yo, monthly periods lasting 6-7 days, heavy with lots of pain. She take BC powders, Midol, IBU, and tylenol all with minimal relief.  She also would like STI testing, had a new partner since last visit. And one of her recent partners told her that he has herpes.  The following portions of the patient's history were reviewed and updated as appropriate: allergies, current medications, past family history, past medical history, past social history, past surgical history, and problem list.  Review of Systems Pertinent items are noted in HPI.   Objective:   Blood pressure 136/76, pulse 72, height 5\' 8"  (1.727 m), weight 203 lb (92.1 kg), last menstrual period 05/24/2023. Body mass index is 30.87 kg/m. Well nourished, well hydrated Black female, no apparent distress She is ambulating and conversing normally. 6 week size anteverted uterus, mobile, non-tender normal adnexal exam  Consent signed, Time out procedure done. Graves speculum used. Frothy vaginal discharge noted. She reports both trich and BV in the past. Cervix prepped with betadine and Hurricaine spray and then grasped with a single tooth tenaculum. Mirena was easily placed and the strings were cut to 3-4 cm. Uterus sounded to 8 cm. She tolerated the procedure well.     Assessment:   1. CIN I (cervical intraepithelial neoplasia I)       Plan:   1. CIN I (cervical intraepithelial neoplasia I) - negative ECC - plan for repeat pap smear in a year  2. Possible exposure to herpes, new partner - She requests testing for look for herpes exposure - She requests STI testing  3. Dysmenorrhea  - schedule  ultrasound - Mirena IUD  4. Vaginal discharge - flagyl prescribed. We discussed that she should not use alcohol with this medication - [facility] testing sent - I strongly rec'd condoms   She will follow up in 3 months/prn sooner

## 2023-06-05 LAB — CERVICOVAGINAL ANCILLARY ONLY
Bacterial Vaginitis (gardnerella): POSITIVE — AB
Candida Glabrata: NEGATIVE
Candida Vaginitis: NEGATIVE
Chlamydia: NEGATIVE
Comment: NEGATIVE
Comment: NEGATIVE
Comment: NEGATIVE
Comment: NEGATIVE
Comment: NEGATIVE
Comment: NORMAL
Neisseria Gonorrhea: NEGATIVE
Trichomonas: NEGATIVE

## 2023-06-05 LAB — RPR: RPR Ser Ql: NONREACTIVE

## 2023-06-05 LAB — HEPATITIS B SURFACE ANTIGEN: Hepatitis B Surface Ag: NEGATIVE

## 2023-06-05 LAB — HSV 1 AND 2 AB, IGG
HSV 1 Glycoprotein G Ab, IgG: 34.8 {index} — ABNORMAL HIGH (ref 0.00–0.90)
HSV 2 IgG, Type Spec: 7.88 {index} — ABNORMAL HIGH (ref 0.00–0.90)

## 2023-06-05 LAB — HEPATITIS C ANTIBODY: Hep C Virus Ab: NONREACTIVE

## 2023-06-05 LAB — HIV ANTIBODY (ROUTINE TESTING W REFLEX): HIV Screen 4th Generation wRfx: NONREACTIVE

## 2023-06-06 LAB — URINE CULTURE

## 2023-06-10 ENCOUNTER — Encounter: Payer: Self-pay | Admitting: Obstetrics & Gynecology

## 2023-06-17 ENCOUNTER — Ambulatory Visit: Payer: 59

## 2023-06-21 ENCOUNTER — Ambulatory Visit
Admission: RE | Admit: 2023-06-21 | Discharge: 2023-06-21 | Disposition: A | Discharge status: Home / Self Care | Payer: 59 | Source: Ambulatory Visit | Attending: Obstetrics & Gynecology | Admitting: Obstetrics & Gynecology

## 2023-06-21 DIAGNOSIS — N946 Dysmenorrhea, unspecified: Secondary | ICD-10-CM | POA: Diagnosis not present

## 2023-06-21 MED ORDER — LIDOCAINE HCL (PF) 1 % IJ SOLN
10.0000 mL | Freq: Once | INTRAMUSCULAR | Status: DC
  Filled 2023-06-21: qty 10

## 2023-07-09 NOTE — Telephone Encounter (Signed)
I called her mobile number to discuss her ultrasound findings (fibroids and IUD too low in the uterus). Her daughter answered the phone and said that she will give her mom a message to call here. I told her that I would be available to chat today.

## 2023-07-12 ENCOUNTER — Telehealth: Payer: Self-pay | Admitting: Obstetrics & Gynecology

## 2023-07-12 NOTE — Telephone Encounter (Signed)
The patient returning missed's calls. Please advise?

## 2023-07-12 NOTE — Telephone Encounter (Signed)
Pt number to be reached at is [phone]. I called her back no answer, Im assuming she is returning [facility] call from 11/5. I left a message saying I would have Dr Marice Potter call back Monday to discuss results.

## 2023-07-15 NOTE — Telephone Encounter (Signed)
Pt returning Dr. Ellin Saba call about her u/s that was done at the hospital.  A good number for her is [phone]

## 2023-07-15 NOTE — Telephone Encounter (Signed)
Pt returning yet another call; I told pt she did have fibroids in her uterus and her IUD is too low in the uterus.  What are the next steps?

## 2023-07-17 NOTE — Telephone Encounter (Signed)
Patient has been scheduled  for Monday 07/22/2023 at 10:35 am with Dr. Marice Potter

## 2023-07-22 ENCOUNTER — Ambulatory Visit: Payer: 59 | Admitting: Obstetrics & Gynecology

## 2023-07-26 ENCOUNTER — Encounter: Payer: Self-pay | Admitting: Obstetrics

## 2023-07-26 ENCOUNTER — Other Ambulatory Visit (HOSPITAL_COMMUNITY)
Admission: RE | Admit: 2023-07-26 | Discharge: 2023-07-26 | Disposition: A | Discharge status: Home / Self Care | Payer: 59 | Source: Ambulatory Visit | Attending: Obstetrics | Admitting: Obstetrics

## 2023-07-26 ENCOUNTER — Ambulatory Visit (INDEPENDENT_AMBULATORY_CARE_PROVIDER_SITE_OTHER): Payer: 59 | Admitting: Obstetrics

## 2023-07-26 VITALS — BP 170/110 | HR 77 | Ht 68.0 in | Wt 213.0 lb

## 2023-07-26 DIAGNOSIS — N760 Acute vaginitis: Secondary | ICD-10-CM | POA: Diagnosis not present

## 2023-07-26 DIAGNOSIS — R102 Pelvic and perineal pain: Secondary | ICD-10-CM

## 2023-07-26 DIAGNOSIS — D259 Leiomyoma of uterus, unspecified: Secondary | ICD-10-CM

## 2023-07-26 DIAGNOSIS — N946 Dysmenorrhea, unspecified: Secondary | ICD-10-CM

## 2023-07-26 DIAGNOSIS — Z113 Encounter for screening for infections with a predominantly sexual mode of transmission: Secondary | ICD-10-CM

## 2023-07-26 DIAGNOSIS — R300 Dysuria: Secondary | ICD-10-CM | POA: Diagnosis not present

## 2023-07-26 DIAGNOSIS — B9689 Other specified bacterial agents as the cause of diseases classified elsewhere: Secondary | ICD-10-CM | POA: Insufficient documentation

## 2023-07-26 LAB — POCT URINALYSIS DIPSTICK
Bilirubin, UA: NEGATIVE
Blood, UA: NEGATIVE
Glucose, UA: NEGATIVE
Ketones, UA: NEGATIVE
Leukocytes, UA: NEGATIVE
Nitrite, UA: NEGATIVE
Protein, UA: NEGATIVE
Spec Grav, UA: 1.01 (ref 1.010–1.025)
Urobilinogen, UA: 1 U/dL
pH, UA: 6.5 (ref 5.0–8.0)

## 2023-07-26 NOTE — Progress Notes (Signed)
    GYNECOLOGY PROGRESS NOTE  Subjective:  PCP: Sandrea Hughs, NP  Patient ID: Lacey Tanner, female    DOB: February 14, 1981, 42 y.o.   MRN: [id number]  HPI  Patient is a 42 y.o. G41P4004 female who presents for IUD removal and vaginal pain. IUD inserted 06/04/23 w/Dr. Marice Potter for HMB, pt does state her bleeding has been lighter since that time. Wants it removed because she saw her [place] mentioned it was displaced. Requesting STI testing today, vaginal swab only. Recent labs for Hepatitis, RPR and HIV from 10/1 negative and pt declining repeat. States she is having vaginal pain, describes as a burning and sometimes a cramping.   Of note, pt's BP today 170/110. Did not take her anti-HTN medication this morning. Denies blurry vision, headache, palpitations, SOB, or swelling.   The following portions of the patient's history were reviewed and updated as appropriate: allergies, current medications, past family history, past medical history, past social history, past surgical history, and problem list.  Review of Systems Pertinent items are noted in HPI.   Objective:   Blood pressure (!) 170/110, pulse 77, height 5\' 8"  (1.727 m), weight 213 lb (96.6 kg), last menstrual period 06/24/2023. Body mass index is 32.39 kg/m.  General appearance: alert, cooperative, and moderately obese Abdomen: soft, non-tender; bowel sounds normal; no masses,  no organomegaly Pelvic: cervix normal in appearance, external genitalia normal, no adnexal masses or tenderness, no cervical motion tenderness, rectovaginal septum normal, vagina normal without discharge, and IUD strings visible Extremities: extremities normal, atraumatic, no cyanosis or edema Neurologic: Grossly normal Psychiatric: possibly inebriated   Assessment/Plan:   1. Vaginal pain   2. Routine screening for STI (sexually transmitted infection)   3. Dysmenorrhea   4. Uterine leiomyoma, unspecified location   5. Dysuria     Encouraged pt to leave IUD in  place. [place] 10/18 mentions fundal fibroids and possibly an intracavitary fibroid vs endometrial polyp, and that her IUD's distal tip is within the endocervical canal. Pt is s/p BTL, received IUD for HMB, which it is helping, and with strings visible today, can still provide some benefit. She agrees and IUD was left in situ. We collected a vaginal swab and will test for [employer], will notify of results. UA obtained for dysuria, WNL. Encouraged to have her annual exam in the next month, as she was asking about pap smear and mammogram.   Follow up 1 month for annual, sooner prn.    Lacey Manson, DO [facility]

## 2023-07-29 ENCOUNTER — Other Ambulatory Visit: Payer: Self-pay

## 2023-07-29 DIAGNOSIS — B9689 Other specified bacterial agents as the cause of diseases classified elsewhere: Secondary | ICD-10-CM

## 2023-07-29 LAB — CERVICOVAGINAL ANCILLARY ONLY
Bacterial Vaginitis (gardnerella): POSITIVE — AB
Candida Glabrata: NEGATIVE
Candida Vaginitis: NEGATIVE
Chlamydia: NEGATIVE
Comment: NEGATIVE
Comment: NEGATIVE
Comment: NEGATIVE
Comment: NEGATIVE
Comment: NEGATIVE
Comment: NORMAL
Neisseria Gonorrhea: NEGATIVE
Trichomonas: NEGATIVE

## 2023-07-29 MED ORDER — METRONIDAZOLE 500 MG PO TABS
500.0000 mg | ORAL_TABLET | Freq: Two times a day (BID) | ORAL | 0 refills | Status: DC
Start: 2023-07-29 — End: 2023-09-20

## 2023-07-29 NOTE — Progress Notes (Signed)
DONE

## 2023-08-26 ENCOUNTER — Ambulatory Visit: Payer: 59

## 2023-09-20 ENCOUNTER — Other Ambulatory Visit (HOSPITAL_COMMUNITY)
Admission: RE | Admit: 2023-09-20 | Discharge: 2023-09-20 | Disposition: A | Discharge status: Home / Self Care | Payer: 59 | Source: Ambulatory Visit

## 2023-09-20 ENCOUNTER — Ambulatory Visit (INDEPENDENT_AMBULATORY_CARE_PROVIDER_SITE_OTHER): Payer: 59

## 2023-09-20 VITALS — BP 106/70 | HR 72 | Ht 68.0 in | Wt 210.5 lb

## 2023-09-20 DIAGNOSIS — Z113 Encounter for screening for infections with a predominantly sexual mode of transmission: Secondary | ICD-10-CM | POA: Insufficient documentation

## 2023-09-20 DIAGNOSIS — R87610 Atypical squamous cells of undetermined significance on cytologic smear of cervix (ASC-US): Secondary | ICD-10-CM | POA: Diagnosis not present

## 2023-09-20 DIAGNOSIS — Z30431 Encounter for routine checking of intrauterine contraceptive device: Secondary | ICD-10-CM

## 2023-09-20 DIAGNOSIS — Z1231 Encounter for screening mammogram for malignant neoplasm of breast: Secondary | ICD-10-CM

## 2023-09-20 DIAGNOSIS — Z01419 Encounter for gynecological examination (general) (routine) without abnormal findings: Secondary | ICD-10-CM | POA: Diagnosis not present

## 2023-09-20 DIAGNOSIS — Z124 Encounter for screening for malignant neoplasm of cervix: Secondary | ICD-10-CM | POA: Diagnosis present

## 2023-09-20 DIAGNOSIS — Z1151 Encounter for screening for human papillomavirus (HPV): Secondary | ICD-10-CM | POA: Insufficient documentation

## 2023-09-20 DIAGNOSIS — R5383 Other fatigue: Secondary | ICD-10-CM

## 2023-09-20 DIAGNOSIS — R87810 Cervical high risk human papillomavirus (HPV) DNA test positive: Secondary | ICD-10-CM | POA: Diagnosis not present

## 2023-09-20 NOTE — Assessment & Plan Note (Addendum)
-   Reviewed health maintenance topics as documented below. - Most recent pap smear in 2024, abnormal, repeat cervical cancer screening due today . -  Mammogram needed, ordered screening today.  - Satisfied with current contraception. Still having some bleeding with Mirena IUD that was placed in October of 2024. Reviewed normal bleeding pattern with IUD, may take 4-6 months for bleeding to stabilize.  - STI screening accepted.   - TSH, CBC, Vit B12, lipid panel, CMP collected for screening. Reviewed with patient need to follow up with PCP for concerns regarding blood pressure and kidney/liver as well as chest pain on upper right side.

## 2023-09-20 NOTE — Progress Notes (Signed)
Outpatient Gynecology Note: Annual Visit  Assessment/Plan:    Lacey Tanner is a 43 y.o. female [phone] with normal well-woman gynecologic exam.   Well woman exam - Reviewed health maintenance topics as documented below. - Most recent pap smear in 2024, abnormal, repeat cervical cancer screening due today . -  Mammogram needed, ordered screening today.  - Satisfied with current contraception. Still having some bleeding with Mirena IUD that was placed in October of 2024. Reviewed normal bleeding pattern with IUD, may take 4-6 months for bleeding to stabilize.  - STI screening accepted.   - TSH, CBC, Vit B12, lipid panel, CMP collected for screening. Reviewed with patient need to follow up with PCP for concerns regarding blood pressure and kidney/liver as well as chest pain on upper right side.      Risk factors identified in Subjective to review: occasional tobacco use, alcohol intake Orders Placed This Encounter  Procedures   MM DIGITAL SCREENING BILATERAL    Standing Status:   Future    Expiration Date:   09/19/2024    Reason for exam::   Screening    Is the patient pregnant?:   No    [facility]   Hepatitis B surface antigen   Hepatitis C antibody   RPR   HIV Antibody (routine testing w rflx)   CBC   B12   Lipid panel   Comprehensive metabolic panel   TSH + Raya Mckinstry T4   Current Outpatient Medications  Medication Instructions   ibuprofen (ADVIL) 800 mg, Oral, Every 8 hours PRN   levonorgestrel (MIRENA) 20 MCG/DAY IUD 1 each,  Once   lisinopril (ZESTRIL) 25 mg, Daily    No follow-ups on file.    Subjective:    Lacey Tanner is a 43 y.o. female [phone] who presents for annual wellness visit.   Occupation not currently working    Lives with daughter  Concerns? Drinks 3 beers a day, has concerns about liver and kidney function given her regular alcohol intake.     Well Woman Visit:  GYN HISTORY:  Patient's last  menstrual period was 08/18/2023 (approximate).     Menstrual History: OB History     Gravida  4   Para  4   Term  4   Preterm      AB      Living  4      SAB      IAB      Ectopic      Multiple      Live Births              Menarche age: 75 Patient's last menstrual period was 08/18/2023 (approximate). Period Duration (Days): 4-5 Period Pattern: (!) Irregular Menstrual Flow: Heavy Menstrual Control: Maxi pad, Panty liner, Thin pad, Tampon Menstrual Control Change Freq (Hours): 2-4 Dysmenorrhea: (!) Severe Dysmenorrhea Symptoms: Cramping   Urinary incontinence? no  Sexually active: yes Number of sexual partners: one Gender of sexual Partners: male Dyspareunia? no Last pap: in 2024, LSIL, needs repeat  History of abnormal Pap: yes STI history: gonorrhea and trich STI/HIV testing or immunizations needed? Yes.    Contraceptive methods: IUD  Health Maintenance > Reviewed breast self-awareness > History of abnormal mammogram: No > Exercise: none, not active > Dietary Supplements: none > Body mass index is 32.01 kg/m.  > Recent dental visit No. > Seat Belt Use: Yes.   > Concern for alcohol abuse? Drinks frequently, averages  about 3 drinks per day, desires labs for kidneys and liver, recommended follow up with PCP  Tobacco or other drug use: Tobacco Use: High Risk (09/20/2023)   Patient History    Smoking Tobacco Use: Some Days    Smokeless Tobacco Use: Never    Passive Exposure: Not on file      If >40:  Last mammogram:  none, ordered today Age at menopause: n/a Last colonoscopy: n/a Last lipid screening: ordered  _________________________________________________________  Current Outpatient Medications  Medication Sig Dispense Refill   ibuprofen (ADVIL) 800 MG tablet Take 1 tablet (800 mg total) by mouth every 8 (eight) hours as needed for cramping. 30 tablet 1   levonorgestrel (MIRENA) 20 MCG/DAY IUD 1 each by Intrauterine route once.  Inserted 06/04/23 w/Dr. Marice Potter     lisinopril (ZESTRIL) 20 MG tablet Take 25 mg by mouth daily.     No current facility-administered medications for this visit.   No Known Allergies  Past Medical History:  Diagnosis Date   Hypertension    History reviewed. No pertinent surgical history. OB History     Gravida  4   Para  4   Term  4   Preterm      AB      Living  4      SAB      IAB      Ectopic      Multiple      Live Births             Social History   Tobacco Use   Smoking status: Some Days    Types: Cigarettes   Smokeless tobacco: Never   Tobacco comments:    Smokes a cigarettes only when she drinks liquor, which is occasionally.    Substance Use Topics   Alcohol use: Yes    Alcohol/week: 21.0 standard drinks of alcohol    Types: 21 Cans of beer per week    Comment: 3 beers daily   Social History   Substance and Sexual Activity  Sexual Activity Yes   Birth control/protection: Surgical, I.U.D.    Immunization History  Administered Date(s) Administered   Hepatitis A 06/21/2004, 08/08/2004, 05/04/2005   Hepatitis B 06/21/2004, 08/08/2004, 05/04/2005   PFIZER(Purple Top)SARS-COV-2 Vaccination 04/11/2020, 05/02/2020   Tdap 10/27/2015     Review Of Systems  Constitutional: Denied constitutional symptoms, night sweats, recent illness, fatigue, fever, insomnia and weight loss.  Eyes: Denied eye symptoms, eye pain, photophobia, vision change and visual disturbance.  Ears/Nose/Throat/Neck: Denied ear, nose, throat or neck symptoms, hearing loss, nasal discharge, sinus congestion and sore throat.  Cardiovascular: Denied cardiovascular symptoms, arrhythmia, chest pain/pressure, edema, exercise intolerance, orthopnea and palpitations.  Respiratory: Denied pulmonary symptoms, asthma, pleuritic pain, productive sputum, cough, dyspnea and wheezing.  Gastrointestinal: Denied, gastro-esophageal reflux, melena, nausea and vomiting.  Genitourinary: Denied  genitourinary symptoms including symptomatic vaginal discharge, pelvic relaxation issues, and urinary complaints.  Musculoskeletal: Positive for chest pain on upper right side.  Dermatologic: Denied dermatology symptoms, rash and scar.  Neurologic: Denied neurology symptoms, dizziness, headache, neck pain and syncope.  Psychiatric: Denied psychiatric symptoms, anxiety and depression.  Endocrine: Denied endocrine symptoms including hot flashes and night sweats.      Objective:    BP 106/70   Pulse 72   Ht 5\' 8"  (1.727 m)   Wt 210 lb 8 oz (95.5 kg)   LMP 08/18/2023 (Approximate)   BMI 32.01 kg/m   Constitutional: Well-developed, well-nourished female in no acute distress Neurological:  Alert and oriented to person, place, and time Psychiatric: Mood and affect appropriate Skin: No rashes or lesions Neck: Supple without masses. Trachea is midline.Thyroid is normal size without masses Lymphatics: No cervical, axillary, supraclavicular, or inguinal adenopathy noted Respiratory: Clear to auscultation bilaterally. Good air movement with normal work of breathing. Cardiovascular: Regular rate and rhythm. Extremities grossly normal, nontender with no edema; pulses regular Gastrointestinal: Soft, nontender, nondistended. No masses or hernias appreciated. No hepatosplenomegaly. No fluid wave. No rebound or guarding. Breast Exam: normal appearance, no masses or tenderness Genitourinary:         External Genitalia: Normal female genitalia    Vagina: well-rugated, no lesions.    Cervix: No lesions, normal size and consistency; no cervical motion tenderness, IUD strings visualized Perineum/Anus: No lesions Rectal: deferred    Autumn Messing, CNM  09/20/23 10:19 AM

## 2023-09-21 LAB — CBC
Hematocrit: 34.5 % (ref 34.0–46.6)
Hemoglobin: 10.9 g/dL — ABNORMAL LOW (ref 11.1–15.9)
MCH: 27.9 pg (ref 26.6–33.0)
MCHC: 31.6 g/dL (ref 31.5–35.7)
MCV: 89 fL (ref 79–97)
Platelets: 275 10*3/uL (ref 150–450)
RBC: 3.9 x10E6/uL (ref 3.77–5.28)
RDW: 17 % — ABNORMAL HIGH (ref 11.7–15.4)
WBC: 4.9 10*3/uL (ref 3.4–10.8)

## 2023-09-21 LAB — VITAMIN B12: Vitamin B-12: 555 pg/mL (ref 232–1245)

## 2023-09-21 LAB — COMPREHENSIVE METABOLIC PANEL
ALT: 27 [IU]/L (ref 0–32)
AST: 33 [IU]/L (ref 0–40)
Albumin: 3.5 g/dL — ABNORMAL LOW (ref 3.9–4.9)
Alkaline Phosphatase: 52 [IU]/L (ref 44–121)
BUN/Creatinine Ratio: 15 (ref 9–23)
BUN: 11 mg/dL (ref 6–24)
Bilirubin Total: 0.3 mg/dL (ref 0.0–1.2)
CO2: 22 mmol/L (ref 20–29)
Calcium: 8.9 mg/dL (ref 8.7–10.2)
Chloride: 101 mmol/L (ref 96–106)
Creatinine, Ser: 0.73 mg/dL (ref 0.57–1.00)
Globulin, Total: 2.6 g/dL (ref 1.5–4.5)
Glucose: 91 mg/dL (ref 70–99)
Potassium: 3.7 mmol/L (ref 3.5–5.2)
Sodium: 135 mmol/L (ref 134–144)
Total Protein: 6.1 g/dL (ref 6.0–8.5)
eGFR: 105 mL/min/{1.73_m2} (ref >59)

## 2023-09-21 LAB — HEPATITIS C ANTIBODY: Hep C Virus Ab: NONREACTIVE

## 2023-09-21 LAB — LIPID PANEL
Chol/HDL Ratio: 2.3 {ratio} (ref 0.0–4.4)
Cholesterol, Total: 112 mg/dL (ref 100–199)
HDL: 48 mg/dL (ref >39)
LDL Chol Calc (NIH): 36 mg/dL (ref 0–99)
Triglycerides: 172 mg/dL — ABNORMAL HIGH (ref 0–149)
VLDL Cholesterol Cal: 28 mg/dL (ref 5–40)

## 2023-09-21 LAB — RPR: RPR Ser Ql: NONREACTIVE

## 2023-09-21 LAB — HIV ANTIBODY (ROUTINE TESTING W REFLEX): HIV Screen 4th Generation wRfx: NONREACTIVE

## 2023-09-21 LAB — TSH+FREE T4
Free T4: 1.09 ng/dL (ref 0.82–1.77)
TSH: 1.17 u[IU]/mL (ref 0.450–4.500)

## 2023-09-21 LAB — HEPATITIS B SURFACE ANTIGEN: Hepatitis B Surface Ag: NEGATIVE

## 2023-09-23 LAB — CERVICOVAGINAL ANCILLARY ONLY
Bacterial Vaginitis (gardnerella): POSITIVE — AB
Candida Glabrata: NEGATIVE
Candida Vaginitis: NEGATIVE
Chlamydia: NEGATIVE
Comment: NEGATIVE
Comment: NEGATIVE
Comment: NEGATIVE
Comment: NEGATIVE
Comment: NEGATIVE
Comment: NORMAL
Neisseria Gonorrhea: NEGATIVE
Trichomonas: NEGATIVE

## 2023-09-24 ENCOUNTER — Other Ambulatory Visit: Payer: Self-pay

## 2023-09-24 DIAGNOSIS — N760 Acute vaginitis: Secondary | ICD-10-CM

## 2023-09-24 DIAGNOSIS — B9689 Other specified bacterial agents as the cause of diseases classified elsewhere: Secondary | ICD-10-CM

## 2023-09-24 MED ORDER — METRONIDAZOLE 500 MG PO TABS: 500.0000 mg | ORAL_TABLET | Freq: Two times a day (BID) | ORAL | 0 refills | Status: AC

## 2023-09-26 LAB — CYTOLOGY - PAP
Comment: NEGATIVE
Comment: NEGATIVE
Comment: NEGATIVE
Diagnosis: UNDETERMINED — AB
HPV 16: NEGATIVE
HPV 18 / 45: NEGATIVE
High risk HPV: POSITIVE — AB

## 2023-10-07 ENCOUNTER — Encounter: Payer: Self-pay | Admitting: Obstetrics & Gynecology

## 2023-10-07 NOTE — Telephone Encounter (Signed)
Reached out to pt to schedule a colpo per Dr. Marice Potter.  Person answering the phone wanted me to leave a message.  I indicated that I would call back.  Dr. Lonny Prude has availability on Feb. 24 at 9:15.

## 2023-10-08 NOTE — Telephone Encounter (Signed)
Reached out to pt (2x) to schedule a colpo per Dr. Marice Potter.  Left message for pt to call back.

## 2023-10-09 NOTE — Telephone Encounter (Signed)
 Reached out to pt (3x) to schedule a colpo per Dr. Everardo Hitch. Left message for pt to call back.

## 2023-10-10 NOTE — Telephone Encounter (Signed)
 The patient contacted the office, She is confirmed for 2/24 at 9:15 am with Dr. Dell Fennel.

## 2023-10-10 NOTE — Telephone Encounter (Signed)
 April called number that was listed for the pt.  The number is her daughter's number.  The pt's phone number is actually turned off.  April asked her daughter if she would get the pt to call in.

## 2023-10-28 ENCOUNTER — Ambulatory Visit: Payer: 59 | Admitting: Obstetrics

## 2023-10-29 NOTE — Progress Notes (Deleted)
   Referring Provider:  ***  HPI:  Lacey Tanner is a 43 y.o.  [phone]  who presents today for evaluation and management of abnormal cervical cytology.    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, CIN-1)   Prior cervical treatment(s): *** Date:*** Results: ***  Symptoms/History:  -Abnormal vaginal discharge: *** -Postmenopausal: *** -Intermenstrual bleeding: *** -Postcoital bleeding: *** -Bleeding problems (non-gyn): *** -Contraception: *** -Number of current sexual partners: *** -Number of partners in lifetime: *** -History of a high risk partner: *** -History of STDs: *** -Smoking: ***      ROS:  {Ros - complete:30496}  OB History  Gravida Para Term Preterm AB Living  4 4 4   4   SAB IAB Ectopic Multiple Live Births          # Outcome Date GA Lbr Len/2nd Weight Sex Type Anes PTL Lv  4 Term           3 Term           2 Term           1 Term             Past Medical History:  Diagnosis Date   Hypertension     No past surgical history on file.  SOCIAL HISTORY:  Social History   Substance and Sexual Activity  Alcohol Use Yes   Alcohol/week: 21.0 standard drinks of alcohol   Types: 21 Cans of beer per week   Comment: 3 beers daily    Social History   Substance and Sexual Activity  Drug Use Yes   Types: Marijuana, Cocaine   Comment: every couple of months     Family History  Problem Relation Age of Onset   Cancer Mother    Lupus Mother    Cancer Maternal Aunt    Diabetes Maternal Grandmother    Cancer Maternal Grandmother     ALLERGIES:  Patient has no known allergies.  She has a current medication list which includes the following prescription(s): ibuprofen, levonorgestrel, and lisinopril.  Physical Exam: -Vitals:  There were no vitals taken for this visit.  PROCEDURE: Colposcopy performed with 4% acetic  acid and Lugol's after informed consent obtained.  Physical Exam                            -Aceto-white Lesions Location(s): See above              -Biopsy performed at *** o'clock               -ECC indicated and performed: {yes no:314532}     -Biopsy sites made hemostatic with pressure and Monsel's solution   -Satisfactory colposcopy: {yes no:314532}    -Evidence of Invasive cervical CA :  NO  ASSESSMENT:  Lacey Tanner is a 43 y.o. [id number] with LSIL***ASCUS and HPV-HR positive*** [birth year] POS***NEG on recent pap (DATE), here for colposcopy today, performed as above without complications.  -ECC and 3 cervical bx sent to pathology -Aftercare instructions for home reviewed, si/sx of when to call/return discussed. -RTC 2 weeks for results, sooner prn  No orders of the defined types were placed in this encounter.          F/U  No follow-ups on file.   Julieanne Manson, DO [facility]

## 2023-10-30 ENCOUNTER — Ambulatory Visit: Payer: 59 | Admitting: Obstetrics

## 2023-10-31 ENCOUNTER — Telehealth: Payer: Self-pay | Admitting: Obstetrics

## 2023-10-31 NOTE — Telephone Encounter (Signed)
 Pt was scheduled on 10/30/2023 for colpo with Dr. Lonny Prude at 9:15.  Was rescheduled to 11/25/2023 at 9:15 with Dr. Lonny Prude.

## 2023-11-22 NOTE — Progress Notes (Deleted)
   Referring Provider:  ***  HPI:  Lacey Tanner is a 43 y.o.  [phone]  who presents today for evaluation and management of abnormal cervical cytology.    Prior pap smears:  Date:09/20/23 [id number] HPV: positive   Prior cervical / vaginal findings: *** Date:*** Results: ***  Prior cervical treatment(s): *** Date:*** Results: ***  HPV Vaccine?  Symptoms/History:  -Abnormal vaginal discharge: *** -Postmenopausal: *** -Intermenstrual bleeding: *** -Postcoital bleeding: *** -Bleeding problems (non-gyn): *** -Contraception: *** -Number of current sexual partners: *** -Number of partners in lifetime: *** -History of a high risk partner: *** -History of STDs: *** -Smoking: ***      ROS:  {Ros - complete:30496}  OB History  Gravida Para Term Preterm AB Living  4 4 4   4   SAB IAB Ectopic Multiple Live Births          # Outcome Date GA Lbr Len/2nd Weight Sex Type Anes PTL Lv  4 Term           3 Term           2 Term           1 Term             Past Medical History:  Diagnosis Date   Hypertension     No past surgical history on file.  SOCIAL HISTORY:  Social History   Substance and Sexual Activity  Alcohol Use Yes   Alcohol/week: 21.0 standard drinks of alcohol   Types: 21 Cans of beer per week   Comment: 3 beers daily    Social History   Substance and Sexual Activity  Drug Use Yes   Types: Marijuana, Cocaine   Comment: every couple of months     Family History  Problem Relation Age of Onset   Cancer Mother    Lupus Mother    Cancer Maternal Aunt    Diabetes Maternal Grandmother    Cancer Maternal Grandmother     ALLERGIES:  Patient has no known allergies.  She has a current medication list which includes the following prescription(s): ibuprofen, levonorgestrel, and lisinopril.  Physical Exam: -Vitals:  There were no vitals taken for this visit.  PROCEDURE: Colposcopy performed with 4% acetic acid and Lugol's after informed consent  obtained.  Physical Exam                            -Aceto-white Lesions Location(s): See above              -Biopsy performed at *** o'clock               -ECC indicated and performed: {yes no:314532}     -Biopsy sites made hemostatic with pressure and Monsel's solution   -Satisfactory colposcopy: {yes no:314532}    -Evidence of Invasive cervical CA :  NO  ASSESSMENT:  Lacey Tanner is a 43 y.o. [id number] with LSIL***ASCUS and HPV-HR positive*** [birth year] POS***NEG on recent pap (DATE), here for colposcopy today, performed as above without complications.  -ECC and 3 cervical bx sent to pathology -Aftercare instructions for home reviewed, si/sx of when to call/return discussed. -RTC 2 weeks for results, sooner prn  No orders of the defined types were placed in this encounter.          F/U  No follow-ups on file.   Julieanne Manson, DO  [facility]

## 2023-11-25 ENCOUNTER — Encounter: Payer: MEDICAID | Admitting: Obstetrics

## 2023-11-25 ENCOUNTER — Telehealth: Payer: Self-pay | Admitting: Obstetrics

## 2023-11-25 DIAGNOSIS — R87610 Atypical squamous cells of undetermined significance on cytologic smear of cervix (ASC-US): Secondary | ICD-10-CM

## 2023-11-25 NOTE — Telephone Encounter (Signed)
 Reached out to pt to reschedule colpo that was scheduled on 11/25/2023 at 9:15 with Dr. Lonny Prude.  Left message for pt to call back to reschedule.

## 2023-11-26 ENCOUNTER — Encounter: Payer: Self-pay | Admitting: Obstetrics

## 2023-11-26 NOTE — Telephone Encounter (Signed)
 Reached out to pt (2x) to reschedule colpo that was scheduled on 11/25/2023 at 9:15 with Dr. Lonny Prude.  Left message for pt to call back to reschedule.  Will send a MyChart letter to pt.

## 2024-01-01 NOTE — Progress Notes (Deleted)
    GYNECOLOGY PROGRESS NOTE  Subjective:  PCP: Ardia Kraft, NP  Patient ID: [place] Butter, female    DOB: November 15, 1980, 43 y.o.   MRN: [id number]  HPI  Patient is a 43 y.o. [id number] female who presents for   {Common ambulatory SmartLinks:19316}  Review of Systems {ros; complete:30496}   Objective:   There were no vitals taken for this visit. There is no height or weight on file to calculate BMI.  General appearance: {general exam:16600} Abdomen: {abdominal exam:16834} Pelvic: {pelvic exam:16852::"cervix normal in appearance","external genitalia normal","no adnexal masses or tenderness","no cervical motion tenderness","rectovaginal septum normal","uterus normal size, shape, and consistency","vagina normal without discharge"} Extremities: {extremity exam:5109} Neurologic: {neuro exam:17854}   Assessment/Plan:   No diagnosis found.   There are no diagnoses linked to this encounter.     Sofia Dunn, DO [facility]

## 2024-01-02 ENCOUNTER — Ambulatory Visit: Payer: MEDICAID | Admitting: Obstetrics

## 2024-01-03 ENCOUNTER — Encounter: Payer: Self-pay | Admitting: Obstetrics

## 2024-01-13 ENCOUNTER — Encounter: Payer: MEDICAID | Admitting: Obstetrics & Gynecology

## 2024-01-13 ENCOUNTER — Encounter: Payer: MEDICAID | Admitting: Obstetrics

## 2024-01-15 ENCOUNTER — Encounter: Payer: MEDICAID | Admitting: Obstetrics & Gynecology

## 2024-02-17 NOTE — Progress Notes (Deleted)
    GYNECOLOGY OFFICE COLPOSCOPY PROCEDURE NOTE  43 y.o. [id number] here for colposcopy for ASCUS with POSITIVE high risk HPV pap smear on 09/20/2023. Discussed role for HPV in cervical dysplasia, need for surveillance.  Patient gave informed written consent, time out was performed.  Placed in lithotomy position. Cervix viewed with speculum and colposcope after application of acetic acid.   Colposcopy adequate? {yes/no:20286}  {Findings; colposcopy:728}; corresponding biopsies obtained.  ECC specimen obtained. All specimens were labeled and sent to pathology.  Chaperone was present during entire procedure.  Patient was given post procedure instructions.  Will follow up pathology and manage accordingly; patient will be contacted with results and recommendations.  Routine preventative health maintenance measures emphasized.    Stephane Ee, MD [facility]

## 2024-02-17 NOTE — Patient Instructions (Incomplete)
 Colposcopy  Colposcopy is a procedure to examine the lowest part of the uterus (cervix) for abnormalities or signs of disease. This procedure is done using an instrument that makes objects appear larger and provides light (colposcope). During the procedure, your health care provider may remove a tissue sample to look at under a microscope (biopsy). A biopsy may be done if any unusual cells are seen during the colposcopy. You may have a colposcopy if you have: An abnormal Pap smear, also called a Pap test. This screening test is used to check for signs of cancer or infection of the vagina, cervix, and uterus. An HPV (human papillomavirus) test and get a positive result for a type of HPV that puts you at high risk of cancer. Certain conditions or symptoms, such as: A sore, or lesion, on your cervix. Genital warts on your vulva, vagina, or cervix. Pain during sex. Vaginal bleeding, especially after sex. A growth on your cervix (cervical polyp) that needs to be removed. Let your health care provider know about: Any allergies you have, including allergies to medicines, latex, or iodine. All medicines you are taking, including vitamins, herbs, eye drops, creams, and over-the-counter medicines. Any bleeding problems you have. Any surgeries you have had. Any medical conditions you have, such as pelvic inflammatory disease (PID) or an endometrial disorder. The pattern of your menstrual cycles and the form of birth control (contraception) you use, if any. Your medical history, including any cervical treatments and how well you tolerated the procedure (if you have ever fainted). Whether you are pregnant or may be pregnant. What are the risks? Generally, this is a safe procedure. However, problems may occur, including: Infection. Symptoms of infection may include fever, bad-smelling vaginal discharge, or pelvic pain. Vaginal bleeding. Allergic reactions to medicines. Damage to nearby structures or  organs. What happens before the procedure? Medicines Ask your health care provider about: Changing or stopping your regular medicines. This is especially important if you are taking diabetes medicines or blood thinners. Taking medicines such as aspirin and ibuprofen. These medicines can thin your blood. Do not take these medicines unless your health care provider tells you to take them. Your health care provider will likely tell you to avoid taking aspirin, or medicine that contains aspirin, for 7 days before the procedure. Taking over-the-counter medicines, vitamins, herbs, and supplements. General instructions Tell your health care provider if you have your menstrual period now or will have it at the time of your procedure. A colposcopy is not normally done during your menstrual period. If you use contraception, continue to use it before your procedure. For 24 hours before the procedure: Do not use douche products or tampons. Do not use medicines, creams, or suppositories in the vagina. Do not have sex or insert anything into your vagina. Ask your health care provider what steps will be taken to prevent infection. What happens during the procedure? You will lie down on your back, with your feet in foot rests (stirrups). An instrument called a speculum will be inserted into your vagina. This will be used so your health care provider can see your cervix and the inside of your vagina. A cotton swab will be used to place a small amount of a liquid (solution) on the areas to be examined. This solution makes it easier to see abnormal cells. You may feel a slight burning during this part. The colposcope will be used to scan the cervix with a bright white light. The colposcope will be held near  your vulva and will make your vulva, vagina, and cervix look bigger so they can be seen better. If a biopsy is needed: You may be given a medicine to numb the area (local anesthetic). Surgical tools will be  used to remove mucus and cells through your vagina. You may feel mild pain while the tissue sample is removed. Bleeding may occur. A solution may be used to stop the bleeding. The tissue removed will be sent to a lab to be looked at under a microscope. The procedure may vary among health care providers and hospitals. What happens after the procedure? You may have some cramping in your abdomen. This should go away after a few minutes. It is up to you to get the results of your procedure. Ask your health care provider, or the department that is doing the procedure, when your results will be ready. Summary Colposcopy is a procedure to examine the lowest part of the uterus (cervix), for signs of disease. A biopsy may be done as part of the procedure. You may have some cramping in your abdomen. This should go away after a few minutes. It is up to you to get the results of your procedure. Ask your health care provider, or the department that is doing the procedure, when your results will be ready. This information is not intended to replace advice given to you by your health care provider. Make sure you discuss any questions you have with your health care provider. Document Revised: 01/15/2021 Document Reviewed: 01/15/2021 Elsevier Patient Education  2024 [employer].

## 2024-02-18 ENCOUNTER — Encounter: Payer: MEDICAID | Admitting: Obstetrics & Gynecology

## 2024-02-20 ENCOUNTER — Other Ambulatory Visit: Payer: Self-pay

## 2024-02-20 ENCOUNTER — Emergency Department: Payer: MEDICAID

## 2024-02-20 ENCOUNTER — Emergency Department
Admission: EM | Admit: 2024-02-20 | Discharge: 2024-02-20 | Disposition: A | Discharge status: Home / Self Care | Payer: MEDICAID

## 2024-02-20 DIAGNOSIS — S63501A Unspecified sprain of right wrist, initial encounter: Secondary | ICD-10-CM | POA: Diagnosis not present

## 2024-02-20 DIAGNOSIS — Y9372 Activity, wrestling: Secondary | ICD-10-CM | POA: Diagnosis not present

## 2024-02-20 DIAGNOSIS — W19XXXA Unspecified fall, initial encounter: Secondary | ICD-10-CM | POA: Insufficient documentation

## 2024-02-20 DIAGNOSIS — M25531 Pain in right wrist: Secondary | ICD-10-CM | POA: Diagnosis present

## 2024-02-20 MED ORDER — KETOROLAC TROMETHAMINE 30 MG/ML IJ SOLN
30.0000 mg | Freq: Once | INTRAMUSCULAR | Status: AC
  Administered 2024-02-20: 30 mg via INTRAMUSCULAR
  Filled 2024-02-20: qty 1

## 2024-02-20 MED ORDER — MELOXICAM 15 MG PO TABS: 15.0000 mg | ORAL_TABLET | Freq: Every day | ORAL | 0 refills | Status: AC

## 2024-02-20 NOTE — Discharge Instructions (Signed)
 You were evaluated in the ED for right wrist pain.  Your x-rays are reassuring as well as your physical examination.  We suspect this to be a wrist sprain.  We have provided you with a brace for comfort you are encouraged to wear this for 1 to 2 weeks.  Remember to remove the brace to perform light wrist range of motion exercises.  Get plenty of rest, apply ice to the affected area twice daily for 20 minutes on 20 minutes off.  Keep wrist elevated above heart level is much as possible.  If symptoms do not improve in 1 week feel free to return to emergency department or follow-up with [facility] for further evaluation.

## 2024-02-20 NOTE — ED Provider Notes (Cosign Needed)
 [facility] Emergency Department Provider Note     Event Date/Time   First MD Initiated Contact with Patient 02/20/24 1522     (approximate)   History   Wrist Pain   HPI  Lacey Tanner is a 43 y.o. female presents to the ED for evaluation of right wrist pain.  Patient reports she was wrestling with her friend yesterday and incidentally fell onto her wrist while in the flexed position. No deformity noted. States some numbness in the carpal tunnel distribution.    Physical Exam   Triage Vital Signs: ED Triage Vitals  Encounter Vitals Group     BP 02/20/24 1356 (!) 150/95     Girls Systolic BP Percentile --      Girls Diastolic BP Percentile --      Boys Systolic BP Percentile --      Boys Diastolic BP Percentile --      Pulse Rate 02/20/24 1356 74     Resp 02/20/24 1356 17     Temp 02/20/24 1356 98.5 F (36.9 C)     Temp src --      SpO2 02/20/24 1356 96 %     Weight 02/20/24 1358 220 lb (99.8 kg)     Height 02/20/24 1358 5' 8 (1.727 m)     Head Circumference --      Peak Flow --      Pain Score 02/20/24 1358 10     Pain Loc --      Pain Education --      Exclude from Growth Chart --     Most recent vital signs: Vitals:   02/20/24 1356  BP: (!) 150/95  Pulse: 74  Resp: 17  Temp: 98.5 F (36.9 C)  SpO2: 96%    General Awake, no distress.  HEENT NCAT.  CV:  Good peripheral perfusion.  RESP:  Normal effort.  ABD:  No distention.  Other:  Right wrist reveals no visible deformity.  Some tenderness over the midline aspect of the ventral side of the wrist.  Neurovascular status intact all throughout.  F ROM of all digits without difficulty.  Good capillary refill.  ED Results / Procedures / Treatments   Labs (all labs ordered are listed, but only abnormal results are displayed) Labs Reviewed - No data to display  RADIOLOGY  I personally viewed and evaluated these images as part of my medical decision making, as well as  reviewing the written report by the radiologist.  ED Provider Interpretation: No acute bony abnormality  DG Forearm Right Result Date: 02/20/2024 CLINICAL DATA:  Right wrist pain after wrestling. EXAM: RIGHT FOREARM - 2 VIEW COMPARISON:  Right wrist radiographs obtained at the same time. FINDINGS: Previously described negative ulnar variance. No fracture or dislocation. IMPRESSION: 1. No fracture. 2. Negative ulnar variance. Electronically Signed   By: Catherin Closs M.D.   On: 02/20/2024 16:12   DG Wrist Complete Right Result Date: 02/20/2024 CLINICAL DATA:  Right wrist pain after wrestling. EXAM: RIGHT WRIST - COMPLETE 3+ VIEW COMPARISON:  Right forearm radiographs obtained at the same time. Left wrist radiographs dated 07/17/2017. FINDINGS: Again demonstrated is shortening of the distal ulna relative to the distal radius. Stable ossified ossicle adjacent to the ulnar styloid. No acute fracture or dislocation seen. IMPRESSION: 1. No acute fracture. 2. Stable negative ulnar variance. Electronically Signed   By: Catherin Closs M.D.   On: 02/20/2024 15:42    PROCEDURES:  Critical Care performed:  No  Procedures   MEDICATIONS ORDERED IN ED: Medications  ketorolac  (TORADOL ) 30 MG/ML injection 30 mg (30 mg Intramuscular Given 02/20/24 1711)     IMPRESSION / MDM / ASSESSMENT AND PLAN / ED COURSE  I reviewed the triage vital signs and the nursing notes.                               43 y.o. female presents to the emergency department for evaluation and treatment of acute right wrist injury. See HPI for further details.   Differential diagnosis includes, but is not limited to fracture, dislocation, sprain, nerve impairment  Patient's presentation is most consistent with acute complicated illness / injury requiring diagnostic workup.  Patient is alert and oriented.  She is hemodynamically stable.  Physical exam findings are as stated above.  X-rays are reassuring.  Patient placed in wrist  splint.  RICE therapy education at home provided.  Patient verbalized understanding.  Presentation is clinically consistent with a sprain of the right wrist.  Patient is in stable condition for discharge home.  Encouraged to follow-up with [facility] if symptoms persist and do not improve in 2 weeks.  ED precautions discussed.  Patient is in agreement with care plan.  FINAL CLINICAL IMPRESSION(S) / ED DIAGNOSES   Final diagnoses:  Sprain of right wrist, initial encounter   Rx / DC Orders   ED Discharge Orders          Ordered    meloxicam (MOBIC) 15 MG tablet  Daily        02/20/24 1638             Note:  This document was prepared using Dragon voice recognition software and may include unintentional dictation errors.    Phyllis Breeze, Axiel Fjeld A, PA-C 02/20/24 1741

## 2024-02-20 NOTE — ED Triage Notes (Signed)
 Pt states wrestling with friend and states pain to R wrist. Possible deformity noted. NAD noted.

## 2024-04-08 ENCOUNTER — Encounter: Payer: MEDICAID | Admitting: Obstetrics and Gynecology

## 2024-04-08 DIAGNOSIS — R87610 Atypical squamous cells of undetermined significance on cytologic smear of cervix (ASC-US): Secondary | ICD-10-CM

## 2024-06-01 ENCOUNTER — Encounter: Payer: MEDICAID | Admitting: Obstetrics & Gynecology

## 2024-06-02 ENCOUNTER — Encounter: Payer: MEDICAID | Admitting: Obstetrics & Gynecology

## 2024-07-08 ENCOUNTER — Encounter: Payer: Self-pay | Admitting: Obstetrics & Gynecology

## 2024-08-20 ENCOUNTER — Ambulatory Visit: Payer: MEDICAID

## 2024-08-20 NOTE — Progress Notes (Deleted)
° ° °  NURSE VISIT NOTE  Subjective:    Patient ID: Lacey Tanner, female    DOB: Oct 09, 1980, 43 y.o.   MRN: [id number]  HPI  Patient is a 43 y.o. G32P4004 female who presents for {pe vag discharge desc:315065} vaginal discharge for *** {gen duration:315003}. Denies abnormal vaginal bleeding or significant pelvic pain or fever. {Actions; denies/reports/admits to:19208} {UTI Symptoms:210800002}. Patient {has/denies:33800} history of known exposure to STD.   Objective:    There were no vitals taken for this visit.   No results found for any visits on 08/20/24.  Assessment:   No diagnosis found.  {vaginitis type:315262}  Plan:   GC and chlamydia DNA  probe sent to lab. Treatment: {vaginitis tx:315263} ROV prn if symptoms persist or worsen.   Harlene Gander, CMA

## 2024-08-21 ENCOUNTER — Emergency Department: Payer: MEDICAID

## 2024-08-21 ENCOUNTER — Observation Stay
Admission: EM | Admit: 2024-08-21 | Discharge: 2024-08-22 | Disposition: A | Discharge status: Home / Self Care | Payer: MEDICAID | Attending: Hospitalist | Admitting: Hospitalist

## 2024-08-21 ENCOUNTER — Other Ambulatory Visit: Payer: Self-pay

## 2024-08-21 DIAGNOSIS — Z79899 Other long term (current) drug therapy: Secondary | ICD-10-CM | POA: Diagnosis not present

## 2024-08-21 DIAGNOSIS — E876 Hypokalemia: Secondary | ICD-10-CM | POA: Insufficient documentation

## 2024-08-21 DIAGNOSIS — R519 Headache, unspecified: Secondary | ICD-10-CM | POA: Diagnosis present

## 2024-08-21 DIAGNOSIS — F1210 Cannabis abuse, uncomplicated: Secondary | ICD-10-CM | POA: Diagnosis not present

## 2024-08-21 DIAGNOSIS — I10 Essential (primary) hypertension: Secondary | ICD-10-CM | POA: Diagnosis not present

## 2024-08-21 DIAGNOSIS — S065XAA Traumatic subdural hemorrhage with loss of consciousness status unknown, initial encounter: Secondary | ICD-10-CM | POA: Diagnosis not present

## 2024-08-21 DIAGNOSIS — F1410 Cocaine abuse, uncomplicated: Secondary | ICD-10-CM | POA: Insufficient documentation

## 2024-08-21 DIAGNOSIS — F1010 Alcohol abuse, uncomplicated: Secondary | ICD-10-CM | POA: Diagnosis not present

## 2024-08-21 DIAGNOSIS — F17210 Nicotine dependence, cigarettes, uncomplicated: Secondary | ICD-10-CM | POA: Diagnosis not present

## 2024-08-21 LAB — URINALYSIS, ROUTINE W REFLEX MICROSCOPIC
Bacteria, UA: NONE SEEN
Bilirubin Urine: NEGATIVE
Glucose, UA: NEGATIVE mg/dL
Hgb urine dipstick: NEGATIVE
Ketones, ur: 5 mg/dL — AB
Leukocytes,Ua: NEGATIVE
Nitrite: NEGATIVE
Protein, ur: 100 mg/dL — AB
Specific Gravity, Urine: 1.016 (ref 1.005–1.030)
pH: 6 (ref 5.0–8.0)

## 2024-08-21 LAB — COMPREHENSIVE METABOLIC PANEL WITH GFR
ALT: 12 U/L (ref 0–44)
AST: 20 U/L (ref 15–41)
Albumin: 4.1 g/dL (ref 3.5–5.0)
Alkaline Phosphatase: 73 U/L (ref 38–126)
Anion gap: 15 (ref 5–15)
BUN: 6 mg/dL (ref 6–20)
CO2: 22 mmol/L (ref 22–32)
Calcium: 9.8 mg/dL (ref 8.9–10.3)
Chloride: 98 mmol/L (ref 98–111)
Creatinine, Ser: 0.59 mg/dL (ref 0.44–1.00)
GFR, Estimated: >60 mL/min
Glucose, Bld: 133 mg/dL — ABNORMAL HIGH (ref 70–99)
Potassium: 3.3 mmol/L — ABNORMAL LOW (ref 3.5–5.1)
Sodium: 134 mmol/L — ABNORMAL LOW (ref 135–145)
Total Bilirubin: 0.5 mg/dL (ref 0.0–1.2)
Total Protein: 7.7 g/dL (ref 6.5–8.1)

## 2024-08-21 LAB — WET PREP, GENITAL
Clue Cells Wet Prep HPF POC: NONE SEEN
Sperm: NONE SEEN
Trich, Wet Prep: NONE SEEN
WBC, Wet Prep HPF POC: <10
Yeast Wet Prep HPF POC: NONE SEEN

## 2024-08-21 LAB — CBC
HCT: 38.8 % (ref 36.0–46.0)
Hemoglobin: 12.8 g/dL (ref 12.0–15.0)
MCH: 29.4 pg (ref 26.0–34.0)
MCHC: 33 g/dL (ref 30.0–36.0)
MCV: 89 fL (ref 80.0–100.0)
Platelets: 421 K/uL — ABNORMAL HIGH (ref 150–400)
RBC: 4.36 MIL/uL (ref 3.87–5.11)
RDW: 14 % (ref 11.5–15.5)
WBC: 7.4 K/uL (ref 4.0–10.5)
nRBC: 0 % (ref 0.0–0.2)

## 2024-08-21 LAB — RESP PANEL BY RT-PCR (RSV, FLU A&B, COVID)  RVPGX2
Influenza A by PCR: NEGATIVE
Influenza B by PCR: NEGATIVE
Resp Syncytial Virus by PCR: NEGATIVE
SARS Coronavirus 2 by RT PCR: NEGATIVE

## 2024-08-21 LAB — CHLAMYDIA/NGC RT PCR (ARMC ONLY)
Chlamydia Tr: NOT DETECTED
N gonorrhoeae: NOT DETECTED

## 2024-08-21 LAB — POC URINE PREG, ED: Preg Test, Ur: NEGATIVE

## 2024-08-21 LAB — URINE DRUG SCREEN
Amphetamines: NEGATIVE
Barbiturates: NEGATIVE
Benzodiazepines: NEGATIVE
Cocaine: POSITIVE — AB
Fentanyl: NEGATIVE
Methadone Scn, Ur: NEGATIVE
Opiates: NEGATIVE
Tetrahydrocannabinol: POSITIVE — AB

## 2024-08-21 LAB — PROTIME-INR
INR: 1 (ref 0.8–1.2)
Prothrombin Time: 13.8 s (ref 11.4–15.2)

## 2024-08-21 MED ORDER — DIPHENHYDRAMINE HCL 25 MG PO CAPS
50.0000 mg | ORAL_CAPSULE | Freq: Once | ORAL | Status: DC
  Filled 2024-08-21: qty 2

## 2024-08-21 MED ORDER — BUTALBITAL-APAP-CAFFEINE 50-325-40 MG PO TABS
2.0000 | ORAL_TABLET | Freq: Four times a day (QID) | ORAL | Status: DC | PRN
  Administered 2024-08-22 (×2): 2 via ORAL
  Filled 2024-08-21 (×2): qty 2

## 2024-08-21 MED ORDER — ONDANSETRON HCL 4 MG/2ML IJ SOLN: 4.0000 mg | Freq: Four times a day (QID) | INTRAMUSCULAR | Status: DC | PRN

## 2024-08-21 MED ORDER — POTASSIUM CHLORIDE CRYS ER 20 MEQ PO TBCR
40.0000 meq | EXTENDED_RELEASE_TABLET | Freq: Once | ORAL | Status: AC
  Administered 2024-08-21: 40 meq via ORAL
  Filled 2024-08-21: qty 2

## 2024-08-21 MED ORDER — FENTANYL CITRATE (PF) 50 MCG/ML IJ SOSY
50.0000 ug | PREFILLED_SYRINGE | Freq: Once | INTRAMUSCULAR | Status: AC
  Administered 2024-08-21: 50 ug via INTRAVENOUS
  Filled 2024-08-21: qty 1

## 2024-08-21 MED ORDER — DEXAMETHASONE SOD PHOSPHATE PF 10 MG/ML IJ SOLN: 10.0000 mg | Freq: Once | INTRAMUSCULAR | Status: DC

## 2024-08-21 MED ORDER — ACETAMINOPHEN 500 MG PO TABS
1000.0000 mg | ORAL_TABLET | Freq: Three times a day (TID) | ORAL | Status: DC | PRN
  Administered 2024-08-21 – 2024-08-22 (×2): 1000 mg via ORAL
  Filled 2024-08-21 (×2): qty 2

## 2024-08-21 MED ORDER — LISINOPRIL 5 MG PO TABS
25.0000 mg | ORAL_TABLET | Freq: Every day | ORAL | Status: DC
  Administered 2024-08-21 – 2024-08-22 (×2): 25 mg via ORAL
  Filled 2024-08-21 (×2): qty 1

## 2024-08-21 MED ORDER — KETOROLAC TROMETHAMINE 30 MG/ML IJ SOLN
30.0000 mg | Freq: Once | INTRAMUSCULAR | Status: DC
  Filled 2024-08-21: qty 1

## 2024-08-21 MED ORDER — LEVETIRACETAM (KEPPRA) 500 MG/5 ML ADULT IV PUSH
1000.0000 mg | INTRAVENOUS | Status: AC
  Administered 2024-08-21: 1000 mg via INTRAVENOUS
  Filled 2024-08-21: qty 10
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
## 2024-08-21 NOTE — ED Provider Notes (Signed)
 CT Head Wo Contrast Result Date: 08/21/2024 EXAM: CT HEAD WITHOUT CONTRAST 08/21/2024 04:23:51 PM TECHNIQUE: CT of the head was performed without the administration of intravenous contrast. Automated exposure control, iterative reconstruction, and/or weight based adjustment of the mA/kV was utilized to reduce the radiation dose to as low as reasonably achievable. COMPARISON: MRI brain 03/08/2022 and CT head 03/28/2016. CLINICAL HISTORY: Headache, increasing frequency or severity. FINDINGS: BRAIN AND VENTRICLES: There is a heterogeneous, partially hyperattenuating right holohemispheric subdural hemorrhage measuring up to 1 cm from the inner table, with mass effect on the subjacent right cerebral hemisphere, including sulcal and right lateral ventricular effacement. There is approximately 0.8 cm of leftward midline shift. There is rightward subfalcine herniation and minimal right uncal herniation. The basal cisterns are overall patent without downward brain herniation. No evidence of acute territorial infarction. No hydrocephalus. ORBITS: No acute abnormality. SINUSES: No acute abnormality. SOFT TISSUES AND SKULL: No acute soft tissue abnormality. No calvarial fracture. IMPRESSION: 1. A 1 cm heterogeneous right holohemispheric subdural hemorrhage with mass effect on the subjacent right cerebral hemisphere, including approximately 0.8 cm of leftward midline shift. 2. No calvarial fracture. 3. These findings were communicated to Dr. Dicky by phone at 4:36 PM. Electronically signed by: Prentice Spade MD 08/21/2024 05:00 PM EST RP Workstation: [id number]   Patient mid to the care of the hospitalist service under Dr. Awanda.      Dicky Anes, MD 08/21/24 [phone]

## 2024-08-21 NOTE — ED Notes (Signed)
 See triage note  Presents with headache for about 2 weeks  States pain is all over    Generalized Afebrile on arrival

## 2024-08-21 NOTE — ED Notes (Signed)
 Informed RN bed assigned
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
## 2024-08-21 NOTE — ED Triage Notes (Signed)
 Pt to ED from UC for concern of generalized 10/10 HA since 2 weeks. States would also like STD check. States hx migraines.

## 2024-08-21 NOTE — Progress Notes (Signed)
 Subjective Patient ID: Lacey Tanner is a 43 y.o. female.    Patient presents to urgent care after leaving ER due to wait time for concerns of 10 out of 10 headache.  She states that the headache has been going on for 2 weeks without break, different than prior headaches and she does have a chronic headache history.  She also notes episodes of vomiting, headache not responsive to medication.  Cannot adequately assess in the setting.  Advised patient to return to ED.   History provided by:  Patient Language interpreter used: No   Headache   Review of Systems  Neurological:  Positive for headaches.    Patient History  Allergies: No Known Allergies  History reviewed. No pertinent past medical history. History reviewed. No pertinent surgical history. Social History   Socioeconomic History   Marital status: Not on file    Spouse name: Not on file   Number of children: Not on file   Years of education: Not on file   Highest education level: Not on file  Occupational History   Not on file  Tobacco Use   Smoking status: Never   Smokeless tobacco: Never  Substance and Sexual Activity   Alcohol use: Not on file   Drug use: Not on file   Sexual activity: Not on file  Other Topics Concern   Not on file  Social History Narrative   Not on file   History reviewed. No pertinent family history. Current Outpatient Medications on File Prior to Visit  Medication Sig Dispense Refill   Levonorgestrel  20 MCG/DAY intrauterine device 1 each by Intrauterine route. (Patient not taking: Reported on 08/21/2024)     No current facility-administered medications on file prior to visit.    Objective  Vitals:   08/21/24 1113 08/21/24 1114  BP: (!) 170/106 (!) 153/84  Pulse: 93 62  Resp: 18   Temp: 37.1 C (98.8 F)   TempSrc: Tympanic   SpO2: 98%   Weight: 80.2 kg   Height: 5' 8   PainSc: 10-Worst pain ever   LMP: 08/20/2024        OBGYN/Pregnancy Status: Having  periods      No results found.  Physical Exam Vitals and nursing note reviewed.  Constitutional:      General: She is not in acute distress.    Appearance: Normal appearance.  HENT:     Head: Normocephalic.     Nose: Nose normal.     Mouth/Throat:     Mouth: Mucous membranes are moist.  Eyes:     Extraocular Movements: Extraocular movements intact.     Conjunctiva/sclera: Conjunctivae normal.  Cardiovascular:     Rate and Rhythm: Normal rate.  Pulmonary:     Effort: Pulmonary effort is normal.  Abdominal:     General: There is no distension.  Musculoskeletal:        General: Normal range of motion.     Cervical back: Normal range of motion.  Skin:    General: Skin is warm.  Neurological:     General: No focal deficit present.     Mental Status: She is alert and oriented to person, place, and time. Mental status is at baseline.     Cranial Nerves: No cranial nerve deficit.     Sensory: No sensory deficit.     Motor: No weakness.     Coordination: Coordination normal.     Gait: Gait normal.     Deep Tendon Reflexes: Reflexes normal.  Comments: Patient does seem slow to respond to questions, states tired and haven't slept.  Psychiatric:        Mood and Affect: Mood normal.        Behavior: Behavior normal.        Judgment: Judgment normal.     No results found for this visit on 08/21/24.     Procedures MDM:     1+ Acute illness or injury that poses a threat to life or bodily function     Assessment requiring historian other than patient: No     Independent visualization of image, tracing, or test: No     Discussion of management with another provider: No     Risk:: High            Assessment/Plan Diagnoses and all orders for this visit:  Acute intractable headache, unspecified headache type     Disposition Status: Emergency Department  Patient Instructions  Patient presents to urgent care after leaving ER due to wait time for concerns of  10 out of 10 headache.  She states that the headache has been going on for 2 weeks without break, different than prior headaches and she does have a chronic headache history.  She also notes episodes of vomiting, headache not responsive to medication.  Cannot adequately assess in the setting.  Progress note signed by Debby Rung, PA on 08/21/24 at  2:54 PM
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
## 2024-08-22 ENCOUNTER — Observation Stay: Payer: MEDICAID

## 2024-08-22 ENCOUNTER — Other Ambulatory Visit: Payer: Self-pay | Admitting: Hospitalist

## 2024-08-22 ENCOUNTER — Other Ambulatory Visit: Payer: Self-pay

## 2024-08-22 DIAGNOSIS — S065XAA Traumatic subdural hemorrhage with loss of consciousness status unknown, initial encounter: Secondary | ICD-10-CM

## 2024-08-22 MED ORDER — LEVETIRACETAM 500 MG PO TABS: 500.0000 mg | ORAL_TABLET | Freq: Two times a day (BID) | ORAL | 0 refills | Status: AC

## 2024-08-22 MED ORDER — TRAMADOL HCL 50 MG PO TABS
50.0000 mg | ORAL_TABLET | Freq: Four times a day (QID) | ORAL | 0 refills | Status: AC | PRN
  Filled 2024-08-22: qty 20, 5d supply, fill #0

## 2024-08-22 MED ORDER — LISINOPRIL 40 MG PO TABS
40.0000 mg | ORAL_TABLET | Freq: Every day | ORAL | 2 refills | Status: AC
  Filled 2024-08-22: qty 30, 30d supply, fill #0

## 2024-08-22 MED ORDER — ACETAMINOPHEN 500 MG PO TABS
1000.0000 mg | ORAL_TABLET | Freq: Three times a day (TID) | ORAL | 1 refills | Status: AC | PRN
  Filled 2024-08-22: qty 30, 5d supply, fill #0

## 2024-08-22 MED ORDER — BUTALBITAL-APAP-CAFFEINE 50-325-40 MG PO TABS
2.0000 | ORAL_TABLET | Freq: Four times a day (QID) | ORAL | 1 refills | Status: AC | PRN
  Filled 2024-08-22: qty 20, 3d supply, fill #0

## 2024-08-22 MED ORDER — ORAL CARE MOUTH RINSE: 15.0000 mL | OROMUCOSAL | Status: DC | PRN
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
## 2024-08-22 NOTE — Plan of Care (Signed)
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
## 2024-08-22 NOTE — Progress Notes (Signed)
" °   08/22/24 0815  Spiritual Encounters  Type of Visit Initial  Care provided to: Patient  Conversation partners present during encounter Nurse  Referral source Other (comment) (Nurse page)  Reason for visit Routine spiritual support  OnCall Visit Yes   Chaplain visited patient in response to a page she received saying patient asked for a Bible.  Chaplain brought the patient the Bible.    Rev. Rana M. Nicholaus, M.Div. Chaplain Resident [facility] "

## 2024-08-24 ENCOUNTER — Other Ambulatory Visit: Payer: Self-pay | Admitting: Neurosurgery

## 2024-08-24 ENCOUNTER — Telehealth: Payer: Self-pay

## 2024-08-24 DIAGNOSIS — S065XAA Traumatic subdural hemorrhage with loss of consciousness status unknown, initial encounter: Secondary | ICD-10-CM

## 2024-08-24 NOTE — Telephone Encounter (Signed)
 f/u weekof 12/29 mo/tu/wed with head ct, noncon. okay for app clinic or me in f/u slot Received: 2 days ago Claudene Penne ORN, MD  P Cns-Neurosurgery Rn; P Cns-Neurosurgery Admin

## 2024-08-24 NOTE — Telephone Encounter (Signed)
 Dr Claudene saw her as a consult on 08/22/24 for a subdural hematoma. Edsel has already ordered the head CT. Follow up appointment has been scheduled. Head CT needs to be done prior to appointment.

## 2024-08-24 NOTE — Telephone Encounter (Signed)
 Auth submitted on [employer]-Pending Case number [id number] The prior authorization you submitted, Case [id number], has been received. Location [facility]   If you wish to speak with [employer] at anytime, please call [phone].

## 2024-08-25 NOTE — Telephone Encounter (Signed)
 CT scheduled for 08/31/2024.

## 2024-08-25 NOTE — Addendum Note (Signed)
 Addended by: GIRARD DON GAILS on: 08/25/2024 08:18 AM   Modules accepted: Orders

## 2024-08-25 NOTE — Telephone Encounter (Signed)
 AUTH approved. I called patient and left a message to call back so she can follow up on the scheduling this scan. I also sent a message to central scheduling to get patient set up.

## 2024-08-31 ENCOUNTER — Ambulatory Visit
Admission: RE | Admit: 2024-08-31 | Discharge: 2024-08-31 | Disposition: A | Discharge status: Home / Self Care | Payer: MEDICAID | Source: Ambulatory Visit | Attending: Neurosurgery | Admitting: Neurosurgery

## 2024-08-31 DIAGNOSIS — S065XAA Traumatic subdural hemorrhage with loss of consciousness status unknown, initial encounter: Secondary | ICD-10-CM | POA: Diagnosis present

## 2024-09-02 ENCOUNTER — Telehealth: Payer: Self-pay | Admitting: Neurosurgery

## 2024-09-02 NOTE — Telephone Encounter (Signed)
 I called the reading room. Will check to be sure it is read on Friday Morning.

## 2024-09-02 NOTE — Telephone Encounter (Signed)
 FYI, CT has been finalized.

## 2024-09-02 NOTE — Telephone Encounter (Signed)
 Ct scan report Dr.Smith requested is still not in the chart, please request these for her appt this Fri.

## 2024-09-04 ENCOUNTER — Ambulatory Visit: Payer: MEDICAID | Admitting: Neurosurgery

## 2024-09-04 NOTE — Telephone Encounter (Signed)
 Pt wants to know if they can have this visit as a telephone visit due to lack of transportation & not wanting to wait till 01/21?

## 2024-09-08 NOTE — Telephone Encounter (Signed)
 Please schedule patient for next available with Dr Arland be virtual visit

## 2024-09-14 NOTE — Telephone Encounter (Addendum)
 Pt called back and prefers in person. Rescheduled.

## 2024-09-30 ENCOUNTER — Ambulatory Visit: Payer: MEDICAID | Admitting: Neurosurgery

## 2024-11-03 ENCOUNTER — Ambulatory Visit: Payer: MEDICAID | Admitting: Certified Nurse Midwife
